# Patient Record
Sex: Female | Born: 2000 | Race: Black or African American | Hispanic: No | Marital: Single | Smoking: Never smoker
Health system: Southern US, Community
[De-identification: ages and names within clinical notes are randomized; demographics above are authoritative.]

## PROBLEM LIST (undated history)

## (undated) DIAGNOSIS — Z789 Other specified health status: Secondary | ICD-10-CM

## (undated) HISTORY — PX: DILATION AND EVACUATION: SHX1459

---
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
## 2021-06-16 ENCOUNTER — Other Ambulatory Visit: Payer: Self-pay

## 2021-06-16 ENCOUNTER — Ambulatory Visit (HOSPITAL_COMMUNITY)
Admission: EM | Admit: 2021-06-16 | Discharge: 2021-06-16 | Disposition: A | Discharge status: Home / Self Care | Payer: Medicaid Other | Attending: Physician Assistant | Admitting: Physician Assistant

## 2021-06-16 ENCOUNTER — Encounter (HOSPITAL_COMMUNITY): Payer: Self-pay

## 2021-06-16 DIAGNOSIS — N760 Acute vaginitis: Secondary | ICD-10-CM | POA: Insufficient documentation

## 2021-06-16 DIAGNOSIS — B9689 Other specified bacterial agents as the cause of diseases classified elsewhere: Secondary | ICD-10-CM | POA: Diagnosis present

## 2021-06-16 DIAGNOSIS — Z113 Encounter for screening for infections with a predominantly sexual mode of transmission: Secondary | ICD-10-CM | POA: Diagnosis present

## 2021-06-16 DIAGNOSIS — N898 Other specified noninflammatory disorders of vagina: Secondary | ICD-10-CM | POA: Insufficient documentation

## 2021-06-16 MED ORDER — METRONIDAZOLE 500 MG PO TABS: 500.0000 mg | ORAL_TABLET | Freq: Two times a day (BID) | ORAL | 0 refills | Status: DC

## 2021-06-16 NOTE — ED Triage Notes (Signed)
Pt presents with vaginal discharge and odor x 4 days.

## 2021-06-16 NOTE — ED Provider Notes (Signed)
[facility]    CSN: [id number] Arrival date & time: 06/16/21  1240      History   Chief Complaint Chief Complaint  Patient presents with   Vaginal Discharge    HPI Julie Clayton is a 20 y.o. female.   Patient presents today with a 4-day history of increased vaginal discharge.  She describes this as copious, clear, malodorous.  She does report using a toy and believes this could have been the cause of her symptoms.  She denies any new sexual partners or specific concerns for STI but is open to testing.  Denies any changes to condoms, personal hygiene products including soaps or detergents, changes to underwear.  She does have a history of similar symptoms ultimately diagnosed as bacterial vaginosis.  Was last treated approximately 2 years ago.  She is confident that she is not pregnant.  She denies any pelvic pain, abdominal pain, nausea, vomiting, fever, urinary symptoms.   History reviewed. No pertinent past medical history.  There are no problems to display for this patient.   History reviewed. No pertinent surgical history.  OB History   No obstetric history on file.      Home Medications    Prior to Admission medications   Medication Sig Start Date End Date Taking? Authorizing Provider  metroNIDAZOLE (FLAGYL) 500 MG tablet Take 1 tablet (500 mg total) by mouth 2 (two) times daily. 06/16/21  Yes Neomi Laidler K, PA-C  diphenhydrAMINE (BENADRYL) 25 MG tablet Take 1 tablet (25 mg total) by mouth every 6 (six) hours. 01/23/17 01/30/17  Maxwell Caul, PA-C  famotidine (PEPCID) 20 MG tablet Take 1 tablet (20 mg total) by mouth 2 (two) times daily. 01/23/17   Maxwell Caul, PA-C    Family History History reviewed. No pertinent family history.  Social History Social History   Tobacco Use   Smoking status: Never   Smokeless tobacco: Never  Substance Use Topics   Alcohol use: No   Drug use: No     Allergies   Patient has no known  allergies.   Review of Systems Review of Systems  Constitutional:  Negative for activity change, appetite change, fatigue and fever.  Respiratory:  Negative for cough and shortness of breath.   Cardiovascular:  Negative for chest pain.  Gastrointestinal:  Negative for abdominal pain, diarrhea, nausea and vomiting.  Genitourinary:  Positive for vaginal discharge. Negative for dysuria, flank pain, frequency, pelvic pain, urgency, vaginal bleeding and vaginal pain.  Musculoskeletal:  Negative for arthralgias and myalgias.  Neurological:  Negative for dizziness, light-headedness and headaches.    Physical Exam Triage Vital Signs ED Triage Vitals  Enc Vitals Group     BP 06/16/21 1530 122/73     Pulse Rate 06/16/21 1529 68     Resp 06/16/21 1529 20     Temp 06/16/21 1529 98.9 F (37.2 C)     Temp Source 06/16/21 1529 Oral     SpO2 06/16/21 1529 100 %     Weight --      Height --      Head Circumference --      Peak Flow --      Pain Score 06/16/21 1526 0     Pain Loc --      Pain Edu? --      Excl. in GC? --    No data found.  Updated Vital Signs BP 122/73   Pulse 68   Temp 98.9 F (37.2 C) (Oral)  Resp 20   LMP 05/28/2021 (Exact Date)   SpO2 100%   Visual Acuity Right Eye Distance:   Left Eye Distance:   Bilateral Distance:    Right Eye Near:   Left Eye Near:    Bilateral Near:     Physical Exam Vitals reviewed.  Constitutional:      General: She is awake. She is not in acute distress.    Appearance: Normal appearance. She is well-developed. She is not ill-appearing.     Comments: Very pleasant female appears stated age in no acute distress  HENT:     Head: Normocephalic and atraumatic.  Cardiovascular:     Rate and Rhythm: Normal rate and regular rhythm.     Heart sounds: Normal heart sounds, S1 normal and S2 normal. No murmur heard. Pulmonary:     Effort: Pulmonary effort is normal.     Breath sounds: Normal breath sounds. No wheezing, rhonchi or  rales.     Comments: Clear to auscultation bilaterally Abdominal:     General: Bowel sounds are normal.     Palpations: Abdomen is soft.     Tenderness: There is no abdominal tenderness. There is no right CVA tenderness, left CVA tenderness, guarding or rebound.     Comments: Benign abdominal exam  Genitourinary:    Comments: Exam deferred Psychiatric:        Behavior: Behavior is cooperative.     UC Treatments / Results  Labs (all labs ordered are listed, but only abnormal results are displayed) Labs Reviewed  CERVICOVAGINAL ANCILLARY ONLY    EKG   Radiology No results found.  Procedures Procedures (including critical care time)  Medications Ordered in UC Medications - No data to display  Initial Impression / Assessment and Plan / UC Course  I have reviewed the triage vital signs and the nursing notes.  Pertinent labs & imaging results that were available during my care of the patient were reviewed by me and considered in my medical decision making (see chart for details).      Patient empirically treated for bacterial vaginosis given clinical presentation.  She was started on metronidazole 500 mg twice daily for 1 week with instruction not to drink any alcohol while on this medication and for 72 hours after completing course due to Antabuse side effects.  We will contact her with her STI swab results if we need to arrange any additional treatment.  Discussed that she should wear loosefitting cotton underwear and use hypoallergenic soaps or detergents.  She is to abstain from sexual activity until results are obtained. Strict return precautions given to which she expressed understanding.  Final Clinical Impressions(s) / UC Diagnoses   Final diagnoses:  Vaginal discharge  BV (bacterial vaginosis)  Routine screening for STI (sexually transmitted infection)     Discharge Instructions      We are treating you for bacterial vaginosis.  Please take metronidazole 500  mg twice daily for 7 days.  Do not drink any alcohol while on this medication and for 72 hours after completing course think cause you to vomit.  Make sure you are drinking plenty of fluid.  Use hypoallergenic soaps and detergents for loosefitting cotton underwear.  We will contact you with your swab results if we need to arrange any additional treatment.  Please abstain from sex until you receive your result.     ED Prescriptions     Medication Sig Dispense Auth. Provider   metroNIDAZOLE (FLAGYL) 500 MG tablet Take 1 tablet (  500 mg total) by mouth 2 (two) times daily. 14 tablet Talasia Saulter, Noberto Retort, PA-C      PDMP not reviewed this encounter.   Jeani Hawking, PA-C 06/16/21 1545

## 2021-06-16 NOTE — Discharge Instructions (Addendum)
We are treating you for bacterial vaginosis.  Please take metronidazole 500 mg twice daily for 7 days.  Do not drink any alcohol while on this medication and for 72 hours after completing course think cause you to vomit.  Make sure you are drinking plenty of fluid.  Use hypoallergenic soaps and detergents for loosefitting cotton underwear.  We will contact you with your swab results if we need to arrange any additional treatment.  Please abstain from sex until you receive your result.

## 2021-06-19 LAB — CERVICOVAGINAL ANCILLARY ONLY
Bacterial Vaginitis (gardnerella): POSITIVE — AB
Candida Glabrata: NEGATIVE
Candida Vaginitis: NEGATIVE
Chlamydia: NEGATIVE
Comment: NEGATIVE
Comment: NEGATIVE
Comment: NEGATIVE
Comment: NEGATIVE
Comment: NEGATIVE
Comment: NORMAL
Neisseria Gonorrhea: NEGATIVE
Trichomonas: NEGATIVE

## 2021-09-14 ENCOUNTER — Ambulatory Visit (HOSPITAL_BASED_OUTPATIENT_CLINIC_OR_DEPARTMENT_OTHER)
Admit: 2021-09-14 | Discharge: 2021-09-14 | Disposition: A | Discharge status: Home / Self Care | Payer: Medicaid Other | Attending: Physician Assistant | Admitting: Physician Assistant

## 2021-09-14 ENCOUNTER — Ambulatory Visit
Admission: EM | Admit: 2021-09-14 | Discharge: 2021-09-14 | Disposition: A | Discharge status: Home / Self Care | Payer: Medicaid Other | Attending: Physician Assistant | Admitting: Physician Assistant

## 2021-09-14 DIAGNOSIS — S6010XA Contusion of unspecified finger with damage to nail, initial encounter: Secondary | ICD-10-CM | POA: Diagnosis present

## 2021-09-14 DIAGNOSIS — M79644 Pain in right finger(s): Secondary | ICD-10-CM | POA: Diagnosis present

## 2021-09-14 DIAGNOSIS — S6991XA Unspecified injury of right wrist, hand and finger(s), initial encounter: Secondary | ICD-10-CM | POA: Insufficient documentation
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
## 2021-09-14 NOTE — Discharge Instructions (Signed)
Please go get an x-ray of your discussed.  We will contact you with these results.  Alternate Tylenol ibuprofen for pain.  Follow-up with hand specialist given this is your dominant hand.  Call them to schedule appointment soon as possible.  If you have any worsening symptoms including difficulty moving your thumb, numbness or tingling, turning colors, feeling cold you need to go to the emergency room.

## 2021-09-14 NOTE — ED Triage Notes (Signed)
Patient states last night she jammed her finger (right thumb) in the door. Patient can move the finger some.

## 2022-02-07 ENCOUNTER — Ambulatory Visit
Admission: EM | Admit: 2022-02-07 | Discharge: 2022-02-07 | Disposition: A | Discharge status: Home / Self Care | Payer: Medicaid Other | Attending: Urgent Care | Admitting: Urgent Care

## 2022-02-07 ENCOUNTER — Encounter: Payer: Self-pay | Admitting: Emergency Medicine

## 2022-02-07 DIAGNOSIS — N949 Unspecified condition associated with female genital organs and menstrual cycle: Secondary | ICD-10-CM | POA: Diagnosis not present

## 2022-02-07 DIAGNOSIS — N760 Acute vaginitis: Secondary | ICD-10-CM | POA: Diagnosis not present

## 2022-02-07 DIAGNOSIS — Z7251 High risk heterosexual behavior: Secondary | ICD-10-CM | POA: Insufficient documentation

## 2022-02-07 DIAGNOSIS — N898 Other specified noninflammatory disorders of vagina: Secondary | ICD-10-CM | POA: Diagnosis not present

## 2022-02-07 DIAGNOSIS — B9689 Other specified bacterial agents as the cause of diseases classified elsewhere: Secondary | ICD-10-CM | POA: Diagnosis not present

## 2022-02-07 DIAGNOSIS — R300 Dysuria: Secondary | ICD-10-CM | POA: Insufficient documentation

## 2022-02-07 LAB — POCT URINALYSIS DIP (MANUAL ENTRY)
Bilirubin, UA: NEGATIVE
Glucose, UA: NEGATIVE mg/dL
Ketones, POC UA: NEGATIVE mg/dL
Nitrite, UA: NEGATIVE
Protein Ur, POC: NEGATIVE mg/dL
Spec Grav, UA: 1.025 (ref 1.010–1.025)
Urobilinogen, UA: 0.2 E.U./dL
pH, UA: 6.5 (ref 5.0–8.0)

## 2022-02-07 LAB — POCT URINE PREGNANCY: Preg Test, Ur: NEGATIVE

## 2022-02-07 MED ORDER — METRONIDAZOLE 500 MG PO TABS: 500.0000 mg | ORAL_TABLET | Freq: Two times a day (BID) | ORAL | 0 refills | Status: DC
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
## 2022-02-07 NOTE — ED Notes (Signed)
Patient states Vaginal discomfort stated yesterday, Some discomfort and odor with urinating. Patient states she had BV in the pass and this feel similar to that.
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
## 2022-02-08 LAB — CERVICOVAGINAL ANCILLARY ONLY
Bacterial Vaginitis (gardnerella): POSITIVE — AB
Candida Glabrata: NEGATIVE
Candida Vaginitis: NEGATIVE
Chlamydia: NEGATIVE
Comment: NEGATIVE
Comment: NEGATIVE
Comment: NEGATIVE
Comment: NEGATIVE
Comment: NEGATIVE
Comment: NORMAL
Neisseria Gonorrhea: NEGATIVE
Trichomonas: NEGATIVE

## 2022-02-08 LAB — URINE CULTURE: Culture: NO GROWTH

## 2022-09-25 ENCOUNTER — Inpatient Hospital Stay (HOSPITAL_COMMUNITY)
Admission: AD | Admit: 2022-09-25 | Discharge: 2022-09-25 | Disposition: A | Discharge status: Home / Self Care | Payer: BLUE CROSS/BLUE SHIELD | Attending: Obstetrics & Gynecology | Admitting: Obstetrics & Gynecology

## 2022-09-25 ENCOUNTER — Inpatient Hospital Stay (HOSPITAL_COMMUNITY): Payer: BLUE CROSS/BLUE SHIELD

## 2022-09-25 ENCOUNTER — Encounter (HOSPITAL_COMMUNITY): Payer: Self-pay | Admitting: Obstetrics & Gynecology

## 2022-09-25 DIAGNOSIS — O26891 Other specified pregnancy related conditions, first trimester: Secondary | ICD-10-CM | POA: Diagnosis not present

## 2022-09-25 DIAGNOSIS — R109 Unspecified abdominal pain: Secondary | ICD-10-CM | POA: Insufficient documentation

## 2022-09-25 DIAGNOSIS — O98811 Other maternal infectious and parasitic diseases complicating pregnancy, first trimester: Secondary | ICD-10-CM | POA: Insufficient documentation

## 2022-09-25 DIAGNOSIS — Z3A01 Less than 8 weeks gestation of pregnancy: Secondary | ICD-10-CM | POA: Insufficient documentation

## 2022-09-25 DIAGNOSIS — O3680X0 Pregnancy with inconclusive fetal viability, not applicable or unspecified: Secondary | ICD-10-CM

## 2022-09-25 DIAGNOSIS — O26851 Spotting complicating pregnancy, first trimester: Secondary | ICD-10-CM | POA: Diagnosis present

## 2022-09-25 DIAGNOSIS — B3731 Acute candidiasis of vulva and vagina: Secondary | ICD-10-CM | POA: Insufficient documentation

## 2022-09-25 DIAGNOSIS — O209 Hemorrhage in early pregnancy, unspecified: Secondary | ICD-10-CM | POA: Diagnosis present

## 2022-09-25 DIAGNOSIS — R102 Pelvic and perineal pain: Secondary | ICD-10-CM | POA: Diagnosis not present

## 2022-09-25 LAB — URINALYSIS, ROUTINE W REFLEX MICROSCOPIC
Bilirubin Urine: NEGATIVE
Glucose, UA: NEGATIVE mg/dL
Ketones, ur: NEGATIVE mg/dL
Nitrite: NEGATIVE
Protein, ur: NEGATIVE mg/dL
Specific Gravity, Urine: 1.006 (ref 1.005–1.030)
pH: 7 (ref 5.0–8.0)

## 2022-09-25 LAB — BASIC METABOLIC PANEL
Anion gap: 8 (ref 5–15)
BUN: 6 mg/dL (ref 6–20)
CO2: 25 mmol/L (ref 22–32)
Calcium: 9.3 mg/dL (ref 8.9–10.3)
Chloride: 100 mmol/L (ref 98–111)
Creatinine, Ser: 0.63 mg/dL (ref 0.44–1.00)
GFR, Estimated: >60 mL/min (ref >60)
Glucose, Bld: 110 mg/dL — ABNORMAL HIGH (ref 70–99)
Potassium: 3.8 mmol/L (ref 3.5–5.1)
Sodium: 133 mmol/L — ABNORMAL LOW (ref 135–145)

## 2022-09-25 LAB — CBC
HCT: 36 % (ref 36.0–46.0)
Hemoglobin: 11.6 g/dL — ABNORMAL LOW (ref 12.0–15.0)
MCH: 27 pg (ref 26.0–34.0)
MCHC: 32.2 g/dL (ref 30.0–36.0)
MCV: 83.9 fL (ref 80.0–100.0)
Platelets: 284 10*3/uL (ref 150–400)
RBC: 4.29 MIL/uL (ref 3.87–5.11)
RDW: 14.1 % (ref 11.5–15.5)
WBC: 7.4 10*3/uL (ref 4.0–10.5)
nRBC: 0 % (ref 0.0–0.2)

## 2022-09-25 LAB — WET PREP, GENITAL
Sperm: NONE SEEN
Trich, Wet Prep: NONE SEEN
WBC, Wet Prep HPF POC: >=10 — AB (ref <10)

## 2022-09-25 LAB — POCT PREGNANCY, URINE: Preg Test, Ur: POSITIVE — AB

## 2022-09-25 LAB — HCG, QUANTITATIVE, PREGNANCY: hCG, Beta Chain, Quant, S: 21904 m[IU]/mL — ABNORMAL HIGH (ref <5)

## 2022-09-25 LAB — HIV ANTIBODY (ROUTINE TESTING W REFLEX): HIV Screen 4th Generation wRfx: NONREACTIVE

## 2022-09-25 MED ORDER — TERCONAZOLE 0.4 % VA CREA: 1.0000 | TOPICAL_CREAM | Freq: Every day | VAGINAL | 0 refills | Status: DC

## 2022-09-25 NOTE — Progress Notes (Signed)
Marie Williams CNM in to discuss test results and d/c plan. Written and verbal d/c instructions given and understanding voiced.  

## 2022-09-25 NOTE — MAU Note (Addendum)
.  Julie Clayton is a 22 y.o. here at 105w2d with vag bleeding earlier today that was brown and mild abdominal cramping. Both have stopped now. Just wanted to be sure everything is ok.  LMP: 08/12/22 Onset of complaint: earlier today Pain score: 0 Vitals:   09/25/22 2013  BP: 133/74  Pulse: 82  Resp: 17  Temp: 99 F (37.2 C)  SpO2: 100%     FHT:na Lab orders placed from triage:  upt and u/a

## 2022-09-25 NOTE — MAU Note (Signed)
Marie Williams CNM in Triage to see pt and discuss plan of care 

## 2022-09-25 NOTE — MAU Provider Note (Addendum)
Chief Complaint: Vaginal Bleeding   Event Date/Time   First Provider Initiated Contact with Patient 09/25/22 2042       SUBJECTIVE HPI: Julie Clayton is a 22 y.o. G1P0 at [redacted]w[redacted]d by LMP who presents to maternity admissions reporting vaginal bleeding today that was brown. Had some minor cramping.  Planning care at [place].Marland Kitchen Has [place] appt at [facility].  She denies vaginal itching/burning, urinary symptoms, h/a, dizziness, n/v, or fever/chills.    Vaginal Bleeding The patient's primary symptoms include pelvic pain and vaginal bleeding. The patient's pertinent negatives include no genital itching or genital odor. This is a new problem. The current episode started today. The pain is mild. She is pregnant. Associated symptoms include abdominal pain. Pertinent negatives include no chills, fever or frequency. The vaginal discharge was bloody. The vaginal bleeding is lighter than menses. She has not been passing clots. She has not been passing tissue. Nothing aggravates the symptoms. She has tried nothing for the symptoms.   RN Note: Julie Clayton is a 22 y.o. here at [redacted]w[redacted]d with vag bleeding earlier today that was brown and mild abdominal cramping. Both have stopped now. Just wanted to be sure everything is ok.  LMP: 08/12/22 Onset of complaint: earlier today          Pain score: 0  History reviewed. No pertinent past medical history. History reviewed. No pertinent surgical history. Social History   Socioeconomic History   Marital status: Single    Spouse name: Not on file   Number of children: Not on file   Years of education: Not on file   Highest education level: Not on file  Occupational History   Not on file  Tobacco Use   Smoking status: Never   Smokeless tobacco: Never  Substance and Sexual Activity   Alcohol use: No   Drug use: No   Sexual activity: Yes  Other Topics Concern   Not on file  Social History Narrative   Not on file   Social Determinants of Health   Financial Resource Strain: Not  on file  Food Insecurity: Not on file  Transportation Needs: Not on file  Physical Activity: Not on file  Stress: Not on file  Social Connections: Not on file  Intimate Partner Violence: Not on file   No current facility-administered medications on file prior to encounter.   Current Outpatient Medications on File Prior to Encounter  Medication Sig Dispense Refill   metroNIDAZOLE (FLAGYL) 500 MG tablet Take 1 tablet (500 mg total) by mouth 2 (two) times daily with a meal. DO NOT CONSUME ALCOHOL WHILE TAKING THIS MEDICATION. 14 tablet 0   No Known Allergies  I have reviewed patient's Past Medical Hx, Surgical Hx, Family Hx, Social Hx, medications and allergies.   ROS:  Review of Systems  Constitutional:  Negative for chills and fever.  Gastrointestinal:  Positive for abdominal pain.  Genitourinary:  Positive for pelvic pain and vaginal bleeding. Negative for frequency.   Review of Systems  Other systems negative   Physical Exam  Physical Exam Patient Vitals for the past 24 hrs:  BP Temp Pulse Resp SpO2 Height Weight  09/25/22 2013 133/74 99 F (37.2 C) 82 17 100 % 5\' 5"  (1.651 m) 73.5 kg   Constitutional: Well-developed, well-nourished female in no acute distress.  Cardiovascular: normal rate Respiratory: normal effort GI: Abd soft, non-tender.  MS: Extremities nontender, no edema, normal ROM Neurologic: Alert and oriented x 4.  GU: Neg CVAT.  PELVIC EXAM: Deferred in lieu  of transvaginal US  LAB RESULTS Results for orders placed or performed during the hospital encounter of 09/25/22 (from the past 24 hour(s))  Urinalysis, Routine w reflex microscopic Urine, Clean Catch     Status: Abnormal   Collection Time: 09/25/22  8:20 PM  Result Value Ref Range   Color, Urine STRAW (A) YELLOW   APPearance HAZY (A) CLEAR   Specific Gravity, Urine 1.006 1.005 - 1.030   pH 7.0 5.0 - 8.0   Glucose, UA NEGATIVE NEGATIVE mg/dL   Hgb urine dipstick SMALL (A) NEGATIVE   Bilirubin  Urine NEGATIVE NEGATIVE   Ketones, ur NEGATIVE NEGATIVE mg/dL   Protein, ur NEGATIVE NEGATIVE mg/dL   Nitrite NEGATIVE NEGATIVE   Leukocytes,Ua SMALL (A) NEGATIVE   RBC / HPF 0-5 0 - 5 RBC/hpf   WBC, UA 0-5 0 - 5 WBC/hpf   Bacteria, UA RARE (A) NONE SEEN   Squamous Epithelial / HPF 6-10 0 - 5 /HPF  Pregnancy, urine POC     Status: Abnormal   Collection Time: 09/25/22  8:28 PM  Result Value Ref Range   Preg Test, Ur POSITIVE (A) NEGATIVE  Wet prep, genital     Status: Abnormal   Collection Time: 09/25/22  8:42 PM   Specimen: PATH Cytology Cervicovaginal Ancillary Only  Result Value Ref Range   Yeast Wet Prep HPF POC PRESENT (A) NONE SEEN   Trich, Wet Prep NONE SEEN NONE SEEN   Clue Cells Wet Prep HPF POC PRESENT (A) NONE SEEN   WBC, Wet Prep HPF POC >=10 (A) <10   Sperm NONE SEEN   CBC     Status: Abnormal   Collection Time: 09/25/22  9:22 PM  Result Value Ref Range   WBC 7.4 4.0 - 10.5 K/uL   RBC 4.29 3.87 - 5.11 MIL/uL   Hemoglobin 11.6 (L) 12.0 - 15.0 g/dL   HCT 36.0 36.0 - 46.0 %   MCV 83.9 80.0 - 100.0 fL   MCH 27.0 26.0 - 34.0 pg   MCHC 32.2 30.0 - 36.0 g/dL   RDW 14.1 11.5 - 15.5 %   Platelets 284 150 - 400 K/uL   nRBC 0.0 0.0 - 0.2 %  ABO/Rh     Status: None   Collection Time: 09/25/22  9:22 PM  Result Value Ref Range   ABO/RH(D)      A POS Performed at [facility], [address] [place], [place] [address]   HIV Antibody (routine testing w rflx)     Status: None   Collection Time: 09/25/22  9:22 PM  Result Value Ref Range   HIV Screen 4th Generation wRfx Non Reactive Non Reactive  Basic metabolic panel     Status: Abnormal   Collection Time: 09/25/22  9:22 PM  Result Value Ref Range   Sodium 133 (L) 135 - 145 mmol/L   Potassium 3.8 3.5 - 5.1 mmol/L   Chloride 100 98 - 111 mmol/L   CO2 25 22 - 32 mmol/L   Glucose, Bld 110 (H) 70 - 99 mg/dL   BUN 6 6 - 20 mg/dL   Creatinine, Ser 0.63 0.44 - 1.00 mg/dL   Calcium 9.3 8.9 - 10.3 mg/dL   GFR,  Estimated >60 >60 mL/min   Anion gap 8 5 - 15  hCG, quantitative, pregnancy     Status: Abnormal   Collection Time: 09/25/22  9:22 PM  Result Value Ref Range   hCG, Beta Chain, Quant, S 21,904 (H) <5 mIU/mL  IMAGING US OB LESS THAN 14 WEEKS WITH OB TRANSVAGINAL  Result Date: 09/25/2022 CLINICAL DATA:  Vaginal bleeding EXAM: OBSTETRIC <14 WK [place] AND TRANSVAGINAL OB US TECHNIQUE: Both transabdominal and transvaginal ultrasound examinations were performed for complete evaluation of the gestation as well as the maternal uterus, adnexal regions, and pelvic cul-de-sac. Transvaginal technique was performed to assess early pregnancy. COMPARISON:  None Available. FINDINGS: Intrauterine gestational sac: Single Yolk sac:  Visualized. Embryo:  Visualized. Cardiac Activity: Visualized. Heart Rate: 116 bpm MSD:   mm    w     d CRL:  3.3 mm   6 w   0 d                  [place] EDC: 05/21/2023 Subchorionic hemorrhage:  None visualized. Maternal uterus/adnexae: No adnexal mass or free fluid. IMPRESSION: Intrauterine pregnancy, 6 weeks 0 days. Fetal heart rate 116 beats per minute. No acute maternal findings. Electronically Signed   By: Charlett Nose M.D.   On: 09/25/2022 22:15     MAU Management/MDM: I have reviewed the triage vital signs and the nursing notes.   Pertinent labs & imaging results that were available during my care of the patient were reviewed by me and considered in my medical decision making (see chart for details).      I have reviewed her medical records including past results, notes and treatments. Medical, Surgical, and family history were reviewed.  Medications and recent lab tests were reviewed  Ordered usual first trimester r/o ectopic labs.   Pelvic cultures done Will check baseline Ultrasound to rule out ectopic.  This bleeding/pain can represent a normal pregnancy with bleeding, spontaneous abortion or even an ectopic which can be life-threatening.  The process as listed above helps to  determine which of these is present.  Reviewed findings with patient who is pleased   She had an app[facility] at [facility] already.  ASSESSMENT Pregnancy at [redacted]w[redacted]d Bleeding in early pregnancy Confirmed LIve IUP at [redacted]w[redacted]d   PLAN Discharge home Encouraged to start prenatal care Bleeding precautions Rx Terazole for yeast  Pt stable at time of discharge. Encouraged to return here if she develops worsening of symptoms, increase in pain, fever, or other concerning symptoms.    Wynelle Bourgeois CNM, MSN Certified Nurse-Midwife 09/25/2022  8:42 PM

## 2022-09-26 DIAGNOSIS — O209 Hemorrhage in early pregnancy, unspecified: Secondary | ICD-10-CM | POA: Diagnosis present

## 2022-09-26 LAB — ABO/RH: ABO/RH(D): A POS

## 2022-09-26 LAB — GC/CHLAMYDIA PROBE AMP (~~LOC~~) NOT AT ARMC
Chlamydia: NEGATIVE
Comment: NEGATIVE
Comment: NORMAL
Neisseria Gonorrhea: NEGATIVE

## 2022-10-10 ENCOUNTER — Ambulatory Visit (HOSPITAL_BASED_OUTPATIENT_CLINIC_OR_DEPARTMENT_OTHER): Payer: Self-pay

## 2022-10-10 ENCOUNTER — Encounter (HOSPITAL_BASED_OUTPATIENT_CLINIC_OR_DEPARTMENT_OTHER): Payer: Self-pay

## 2022-10-10 DIAGNOSIS — O3680X0 Pregnancy with inconclusive fetal viability, not applicable or unspecified: Secondary | ICD-10-CM

## 2022-10-15 ENCOUNTER — Telehealth (HOSPITAL_BASED_OUTPATIENT_CLINIC_OR_DEPARTMENT_OTHER): Payer: Self-pay | Admitting: *Deleted

## 2022-10-15 NOTE — Telephone Encounter (Signed)
LMOVM for pt to call to set up appt;

## 2022-12-08 ENCOUNTER — Emergency Department (HOSPITAL_BASED_OUTPATIENT_CLINIC_OR_DEPARTMENT_OTHER)
Admission: EM | Admit: 2022-12-08 | Discharge: 2022-12-08 | Disposition: A | Discharge status: Home / Self Care | Payer: Self-pay | Attending: Emergency Medicine | Admitting: Emergency Medicine

## 2022-12-08 ENCOUNTER — Other Ambulatory Visit: Payer: Self-pay

## 2022-12-08 ENCOUNTER — Encounter (HOSPITAL_BASED_OUTPATIENT_CLINIC_OR_DEPARTMENT_OTHER): Payer: Self-pay

## 2022-12-08 DIAGNOSIS — F10920 Alcohol use, unspecified with intoxication, uncomplicated: Secondary | ICD-10-CM | POA: Insufficient documentation

## 2022-12-08 MED ORDER — ONDANSETRON HCL 4 MG/2ML IJ SOLN
4.0000 mg | Freq: Once | INTRAMUSCULAR | Status: AC
  Administered 2022-12-08: 4 mg via INTRAVENOUS
  Filled 2022-12-08: qty 2

## 2022-12-08 MED ORDER — SODIUM CHLORIDE 0.9 % IV BOLUS
1000.0000 mL | Freq: Once | INTRAVENOUS | Status: AC
  Administered 2022-12-08: 1000 mL via INTRAVENOUS

## 2022-12-08 NOTE — ED Triage Notes (Addendum)
Patient states "wants to get checked and make sure didn't drink to much"  alert oriented. + Nausea and vomiting. Ambulatory to room

## 2022-12-08 NOTE — ED Provider Notes (Signed)
[facility] Emergency Department Provider Note MRN:  [id number]  Arrival date & time: 12/08/22     Chief Complaint   Alcohol Intoxication   History of Present Illness   Julie Clayton is a 22 y.o. year-old female with no pertinent past medical history presenting to the ED with chief complaint of alcohol intoxication.  Patient concerned that she drank too much too quickly.  According to friend she drank at least 9 shots of liquor over the course of 1 or 2 hours.  Feeling very intoxicated, feeling nauseated.  Denies any trauma or pain at this time.  Drank a lot because going through a lot emotionally at this time.  Review of Systems  A thorough review of systems was obtained and all systems are negative except as noted in the HPI and PMH.   Patient's Health History   History reviewed. No pertinent past medical history.  History reviewed. No pertinent surgical history.  History reviewed. No pertinent family history.  Social History   Socioeconomic History   Marital status: Single    Spouse name: Not on file   Number of children: Not on file   Years of education: Not on file   Highest education level: Not on file  Occupational History   Not on file  Tobacco Use   Smoking status: Never   Smokeless tobacco: Never  Vaping Use   Vaping Use: Never used  Substance and Sexual Activity   Alcohol use: Yes   Drug use: No   Sexual activity: Yes  Other Topics Concern   Not on file  Social History Narrative   Not on file   Social Determinants of Health   Financial Resource Strain: Not on file  Food Insecurity: Not on file  Transportation Needs: Not on file  Physical Activity: Not on file  Stress: Not on file  Social Connections: Not on file  Intimate Partner Violence: Not on file     Physical Exam   Vitals:   12/08/22 0415 12/08/22 0430  BP: 124/80 106/73  Pulse: 68 97  Resp:  16  Temp:    SpO2: 100% 100%    CONSTITUTIONAL: Well-appearing,  NAD NEURO/PSYCH:  Alert and oriented x 3, no focal deficits EYES:  eyes equal and reactive ENT/NECK:  no LAD, no JVD CARDIO: Regular rate, well-perfused, normal S1 and S2 PULM:  CTAB no wheezing or rhonchi GI/GU:  non-distended, non-tender MSK/SPINE:  No gross deformities, no edema SKIN:  no rash, atraumatic   *Additional and/or pertinent findings included in MDM below  Diagnostic and Interventional Summary    EKG Interpretation  Date/Time:    Ventricular Rate:    PR Interval:    QRS Duration:   QT Interval:    QTC Calculation:   R Axis:     Text Interpretation:         Labs Reviewed - No data to display  No orders to display    Medications  sodium chloride 0.9 % bolus 1,000 mL (0 mLs Intravenous Stopped 12/08/22 0438)  ondansetron (ZOFRAN) injection 4 mg (4 mg Intravenous Given 12/08/22 0316)     Procedures  /  Critical Care Procedures  ED Course and Medical Decision Making  Initial Impression and Ddx Alcohol intoxication.  Currently intoxicated but awake, conversant.  Nontraumatic.  Vital signs normal, will allow for metabolization and reassess.  Past medical/surgical history that increases complexity of ED encounter: None  Interpretation of Diagnostics Laboratory and/or imaging options to aid in the diagnosis/care  of the patient were considered.  After careful history and physical examination, it was determined that there was no indication for diagnostics at this time. Patient Reassessment and Ultimate Disposition/Management     Patient feeling better, clinically appears sober enough for discharge.  Has a ride home.  Patient management required discussion with the following services or consulting groups:  None  Complexity of Problems Addressed Acute illness or injury that poses threat of life of bodily function  Additional Data Reviewed and Analyzed Further history obtained from: Further history from spouse/family member  Additional Factors Impacting ED  Encounter Risk None  Barth Kirks. Sedonia Small, [place] [email or web address]  Final Clinical Impressions(s) / ED Diagnoses     ICD-10-CM   1. Alcoholic intoxication without complication ([place])  0000000       ED Discharge Orders     None        Discharge Instructions Discussed with and Provided to Patient:     Discharge Instructions      You were evaluated in the Emergency Department and after careful evaluation, we did not find any emergent condition requiring admission or further testing in the hospital.  Your exam/testing today is overall reassuring.  Please return to the Emergency Department if you experience any worsening of your condition.   Thank you for allowing [place] to be a part of your care.       Maudie Flakes, MD 12/08/22 [phone]

## 2022-12-08 NOTE — Discharge Instructions (Signed)
You were evaluated in the Emergency Department and after careful evaluation, we did not find any emergent condition requiring admission or further testing in the hospital.  Your exam/testing today is overall reassuring.  Please return to the Emergency Department if you experience any worsening of your condition.   Thank you for allowing us to be a part of your care. 

## 2023-04-01 ENCOUNTER — Encounter (HOSPITAL_BASED_OUTPATIENT_CLINIC_OR_DEPARTMENT_OTHER): Payer: Self-pay

## 2023-04-01 ENCOUNTER — Ambulatory Visit (INDEPENDENT_AMBULATORY_CARE_PROVIDER_SITE_OTHER): Payer: Self-pay | Admitting: Obstetrics & Gynecology

## 2023-04-01 ENCOUNTER — Ambulatory Visit (INDEPENDENT_AMBULATORY_CARE_PROVIDER_SITE_OTHER): Payer: Self-pay | Admitting: *Deleted

## 2023-04-01 ENCOUNTER — Other Ambulatory Visit (INDEPENDENT_AMBULATORY_CARE_PROVIDER_SITE_OTHER): Payer: BLUE CROSS/BLUE SHIELD

## 2023-04-01 ENCOUNTER — Encounter (HOSPITAL_BASED_OUTPATIENT_CLINIC_OR_DEPARTMENT_OTHER): Payer: Self-pay | Admitting: Obstetrics & Gynecology

## 2023-04-01 ENCOUNTER — Other Ambulatory Visit (HOSPITAL_COMMUNITY)
Admission: RE | Admit: 2023-04-01 | Discharge: 2023-04-01 | Disposition: A | Discharge status: Home / Self Care | Payer: BLUE CROSS/BLUE SHIELD | Source: Ambulatory Visit | Attending: Obstetrics & Gynecology | Admitting: Obstetrics & Gynecology

## 2023-04-01 VITALS — BP 130/73 | HR 55 | Ht 64.0 in | Wt 162.0 lb

## 2023-04-01 DIAGNOSIS — Z3401 Encounter for supervision of normal first pregnancy, first trimester: Secondary | ICD-10-CM

## 2023-04-01 DIAGNOSIS — Z3A10 10 weeks gestation of pregnancy: Secondary | ICD-10-CM | POA: Diagnosis not present

## 2023-04-01 DIAGNOSIS — F419 Anxiety disorder, unspecified: Secondary | ICD-10-CM

## 2023-04-01 DIAGNOSIS — Z3481 Encounter for supervision of other normal pregnancy, first trimester: Secondary | ICD-10-CM

## 2023-04-01 DIAGNOSIS — Z3480 Encounter for supervision of other normal pregnancy, unspecified trimester: Secondary | ICD-10-CM

## 2023-04-01 DIAGNOSIS — O3680X1 Pregnancy with inconclusive fetal viability, fetus 1: Secondary | ICD-10-CM

## 2023-04-01 DIAGNOSIS — O3680X0 Pregnancy with inconclusive fetal viability, not applicable or unspecified: Secondary | ICD-10-CM

## 2023-04-01 DIAGNOSIS — Z1331 Encounter for screening for depression: Secondary | ICD-10-CM

## 2023-04-01 MED ORDER — PRENATAL VITAMINS 28-0.8 MG PO TABS: 1.0000 | ORAL_TABLET | Freq: Every day | ORAL | 11 refills | Status: DC

## 2023-04-01 MED ORDER — ASPIRIN 81 MG PO CHEW: 81.0000 mg | CHEWABLE_TABLET | Freq: Every day | ORAL | 8 refills | Status: DC

## 2023-04-01 NOTE — Progress Notes (Signed)
New OB Intake  I explained I am completing New OB Intake today. We discussed EDD of 10/23/2023, by Last Menstrual Period. Pt is G2P0010. I reviewed her allergies, medications and Medical/Surgical/OB history.    Patient Active Problem List   Diagnosis Date Noted   Supervision of other normal pregnancy, antepartum 04/01/2023    Concerns addressed today  Delivery Plans Plans to deliver at [facility]. Discussed the nature of our practice with multiple providers including residents and students. Due to the size of the practice, the delivering provider may not be the same as those providing prenatal care.   Patient is not interested in water birth.   MyChart/Babyscripts MyChart access verified. I explained pt will have some visits in office and some virtually. Babyscripts instructions given and order placed. Patient verifies receipt of registration text/e-mail. Account successfully created and app downloaded.  Blood Pressure Cuff/Weight Scale Patient is self-pay; explained patient will be given BP cuff at first prenatal appt. Explained after first prenatal appt pt will check weekly and document in Babyscripts. Patient does not have weight scale; patient may purchase if they desire to track weight weekly in Babyscripts.  Anatomy US Explained first scheduled [place] will be around 19 weeks. Anatomy US scheduled for 05/31/23 at 0930.  Is patient a CenteringPregnancy candidate?  Declined Declined due to Group setting    Is patient a Mom+Baby Combined Care candidate?  Declined   If accepted, confirm patient does not intend to move from the area for at least 12 months, then notify Mom+Baby staff  Interested in [place]? no   First visit review I reviewed new OB appt with patient. Explained pt will be seen by Dr. Hyacinth Meeker at first visit. Discussed Avelina Laine genetic screening with patient. Pt would like both Panorama and Horizon.. Routine prenatal labs are needed.   Last Pap No results found for:  "DIAGPAP"  Harrie Jeans, RN 04/01/2023  11:38 AM

## 2023-04-01 NOTE — Progress Notes (Unsigned)
History:   Georgi Tuel is a 22 y.o. G2P0010 at [redacted]w[redacted]d by {Ob dating:14516} being seen today for her first obstetrical visit.  Her obstetrical history is significant for {ob risk factors:10154}. Patient {does/does not:19097} intend to breast feed. Pregnancy history fully reviewed.  Patient reports {sx:14538}.      HISTORY: OB History  Gravida Para Term Preterm AB Living  2 0 0 0 1 0  SAB IAB Ectopic Multiple Live Births  0 1 0 0 0    # Outcome Date GA Lbr Len/2nd Weight Sex Type Anes PTL Lv  2 Current           1 IAB 11/2022            Last pap smear was done *** and was {Normal/Abnormal Appearance:21344::"normal"}  History reviewed. No pertinent past medical history. History reviewed. No pertinent surgical history. Family History  Problem Relation Age of Onset   Asthma Mother    Hypertension Father    Asthma Brother    Diabetes Maternal Grandmother    Cancer Maternal Grandfather        lung   Alopecia Paternal Grandmother    Social History   Tobacco Use   Smoking status: Never   Smokeless tobacco: Never  Vaping Use   Vaping status: Never Used  Substance Use Topics   Alcohol use: Not Currently   Drug use: No   No Known Allergies No current outpatient medications on file prior to visit.   No current facility-administered medications on file prior to visit.    Review of Systems Pertinent items noted in HPI and remainder of comprehensive ROS otherwise negative.  Physical Exam:   Vitals:   04/01/23 1036  BP: 130/73  Pulse: (!) 55  Weight: 162 lb (73.5 kg)     ***Bedside Ultrasound for FHR check: Viable intrauterine pregnancy with positive cardiac activity noted, fetal heart rate ***bpm Patient informed that the ultrasound is considered a limited obstetric ultrasound and is not intended to be a complete ultrasound exam.  Patient also informed that the ultrasound is not being completed with the intent of assessing for fetal or placental anomalies or any  pelvic abnormalities.  Explained that the purpose of today's ultrasound is to assess for fetal heart rate.  Patient acknowledges the purpose of the exam and the limitations of the study. General: well-developed, well-nourished female in no acute distress  Breasts:  normal appearance, no masses or tenderness bilaterally  Skin: normal coloration and turgor, no rashes  Neurologic: oriented, normal, negative, normal mood  Extremities: normal strength, tone, and muscle mass, ROM of all joints is normal  HEENT PERRLA, extraocular movement intact and sclera clear, anicteric  Neck supple and no masses  Cardiovascular: regular rate and rhythm  Respiratory:  no respiratory distress, normal breath sounds  Abdomen: soft, non-tender; bowel sounds normal; no masses,  no organomegaly  Pelvic: normal external genitalia, no lesions, normal vaginal mucosa, normal vaginal discharge, normal cervix, ***pap smear done. Uterine size:      Assessment:    Pregnancy: G2P0010 Patient Active Problem List   Diagnosis Date Noted   Supervision of other normal pregnancy, antepartum 04/01/2023     Plan:    1. Positive depression screening ***  2. Anxiety ***  3. [redacted] weeks gestation of pregnancy *** - ABO/Rh - Antibody screen - CBC - Hepatitis B surface antigen - HIV Antibody (routine testing w rflx) - HIV (Save tube for possible reflex) - RPR - Rubella screen - Hepatitis  C antibody - Urine Culture - Cytology - PAP( [facility]) - [place] MFM OB COMP + 14 WK; Future  4. Encounter for supervision of normal first pregnancy in first trimester *** - ABO/Rh - Antibody screen - CBC - Hepatitis B surface antigen - HIV Antibody (routine testing w rflx) - HIV (Save tube for possible reflex) - RPR - Rubella screen - Hepatitis C antibody - Urine Culture - Cytology - PAP( [place]) - [place] MFM OB COMP + 14 WK; Future  5. Encounter for supervision of other normal pregnancy in first trimester *** - PANORAMA  PRENATAL TEST - [place]ON Basic Panel  6. Encounter for supervision of other normal pregnancy, first trimester ***  7. Supervision of other normal pregnancy, antepartum ***   Initial labs drawn. Continue prenatal vitamins. Problem list reviewed and updated. Genetic Screening discussed, {Blank multiple:19196::"First trimester screen","Quad screen","NIPS"}: {requests/ordered/declines:14581}. Ultrasound discussed; fetal anatomic survey: {requests/ordered/declines:14581}. Anticipatory guidance about prenatal visits given including labs, ultrasounds, and testing. Discussed usage of Babyscripts and virtual visits as additional source of managing and completing prenatal visits in midst of coronavirus and pandemic.   Encouraged to complete MyChart Registration for her ability to review results, send requests, and have questions addressed.  The nature of [place] [facility]ulty Practice with multiple MDs and Advanced Practice Providers was explained to patient; also emphasized that residents, students are part of our team. Routine obstetric precautions reviewed. Encouraged to seek out care at office or emergency room [facility] preferred) for urgent and/or emergent concerns. No follow-ups on file.     Lum Keas, MD, FACOG Obstetrician & Gynecologist, [facility] for [facility], [facility]

## 2023-04-02 LAB — CBC
Hematocrit: 38.2 % (ref 34.0–46.6)
Hemoglobin: 11.7 g/dL (ref 11.1–15.9)
MCH: 26.3 pg — ABNORMAL LOW (ref 26.6–33.0)
MCHC: 30.6 g/dL — ABNORMAL LOW (ref 31.5–35.7)
MCV: 86 fL (ref 79–97)
Platelets: 241 10*3/uL (ref 150–450)
RBC: 4.45 x10E6/uL (ref 3.77–5.28)
RDW: 14.9 % (ref 11.7–15.4)
WBC: 4.8 10*3/uL (ref 3.4–10.8)

## 2023-04-02 LAB — ANTIBODY SCREEN: Antibody Screen: NEGATIVE

## 2023-04-02 LAB — HIV ANTIBODY (ROUTINE TESTING W REFLEX): HIV Screen 4th Generation wRfx: NONREACTIVE

## 2023-04-02 LAB — HEPATITIS C ANTIBODY: Hep C Virus Ab: NONREACTIVE

## 2023-04-02 LAB — HEPATITIS B SURFACE ANTIGEN: Hepatitis B Surface Ag: NEGATIVE

## 2023-04-02 LAB — ABO/RH: Rh Factor: POSITIVE

## 2023-04-02 LAB — URINE CULTURE

## 2023-04-02 LAB — RUBELLA SCREEN: Rubella Antibodies, IGG: 1.04 index (ref >0.99)

## 2023-04-02 LAB — RPR: RPR Ser Ql: NONREACTIVE

## 2023-04-03 LAB — CYTOLOGY - PAP
Chlamydia: NEGATIVE
Comment: NEGATIVE
Comment: NORMAL
Diagnosis: NEGATIVE
Neisseria Gonorrhea: NEGATIVE

## 2023-04-05 ENCOUNTER — Telehealth (HOSPITAL_BASED_OUTPATIENT_CLINIC_OR_DEPARTMENT_OTHER): Payer: Self-pay | Admitting: *Deleted

## 2023-04-05 NOTE — Telephone Encounter (Signed)
LMOVM for pt to call office regarding elevated BP reading.

## 2023-04-09 ENCOUNTER — Telehealth (HOSPITAL_BASED_OUTPATIENT_CLINIC_OR_DEPARTMENT_OTHER): Payer: Self-pay | Admitting: *Deleted

## 2023-04-09 NOTE — Telephone Encounter (Signed)
Attempted to call pt. VM full, unable to leave message.

## 2023-05-06 ENCOUNTER — Encounter (HOSPITAL_BASED_OUTPATIENT_CLINIC_OR_DEPARTMENT_OTHER): Payer: Self-pay | Admitting: Obstetrics & Gynecology

## 2023-05-06 ENCOUNTER — Ambulatory Visit (INDEPENDENT_AMBULATORY_CARE_PROVIDER_SITE_OTHER): Payer: BLUE CROSS/BLUE SHIELD | Admitting: Obstetrics & Gynecology

## 2023-05-06 VITALS — BP 134/80 | HR 78 | Wt 162.6 lb

## 2023-05-06 DIAGNOSIS — Z3A15 15 weeks gestation of pregnancy: Secondary | ICD-10-CM

## 2023-05-06 DIAGNOSIS — Z3480 Encounter for supervision of other normal pregnancy, unspecified trimester: Secondary | ICD-10-CM

## 2023-05-06 NOTE — Progress Notes (Signed)
   PRENATAL VISIT NOTE  Subjective:  Julie Clayton is a 22 y.o. G2P0010 at [redacted]w[redacted]d being seen today for ongoing prenatal care.  She is currently monitored for the following issues for this low-risk pregnancy and has Supervision of other normal pregnancy, antepartum on their problem list.  Patient reports no complaints.  Contractions: Not present. Vag. Bleeding: None.  Movement: Present. Denies leaking of fluid.   The following portions of the patient's history were reviewed and updated as appropriate: allergies, current medications, past family history, past medical history, past social history, past surgical history and problem list.   Objective:   Vitals:   05/06/23 1141  BP: 134/80  Pulse: 78  Weight: 162 lb 9.6 oz (73.8 kg)    Fetal Status: Fetal Heart Rate (bpm): 145   Movement: Present     General:  Alert, oriented and cooperative. Patient is in no acute distress.  Skin: Skin is warm and dry. No rash noted.   Cardiovascular: Normal heart rate noted  Respiratory: Normal respiratory effort, no problems with respiration noted  Abdomen: Soft, gravid, appropriate for gestational age.  Pain/Pressure: Absent     Pelvic: Cervical exam deferred        Extremities: Normal range of motion.  Edema: None  Mental Status: Normal mood and affect. Normal behavior. Normal judgment and thought content.   Assessment and Plan:  Pregnancy: G2P0010 at [redacted]w[redacted]d 1. Supervision of other normal pregnancy, antepartum - on PNV - importance of baby ASA discussed - anatomy u/s schedule 05/31/2023 - recheck 4 weeks  2. [redacted] weeks gestation of pregnancy - AFP, Serum, Open Spina Bifida  Preterm labor symptoms and general obstetric precautions including but not limited to vaginal bleeding, contractions, leaking of fluid and fetal movement were reviewed in detail with the patient. Please refer to After Visit Summary for other counseling recommendations.   Return in about 4 weeks (around 06/03/2023).  Future  Appointments  Date Time Provider Department Center  05/31/2023  9:15 AM [facility] NURSE [facility]  05/31/2023  9:30 AM [facility] US4 [facility]  06/03/2023 10:35 AM Jerene Bears, MD [facility]  06/28/2023 10:15 AM Jerene Bears, MD [facility]  07/26/2023 10:15 AM Jerene Bears, MD [facility]  08/16/2023 10:55 AM Jerene Bears, MD [facility]  08/30/2023 10:35 AM Jerene Bears, MD [facility]  09/13/2023 10:35 AM Jerene Bears, MD [facility]  09/27/2023 10:05 AM Jerene Bears, MD [facility]  10/04/2023 10:15 AM Jerene Bears, MD [facility]  10/11/2023 10:15 AM Jerene Bears, MD [facility]  10/18/2023 10:55 AM Jerene Bears, MD [facility]  10/25/2023 10:35 AM Jerene Bears, MD [facility]    Jerene Bears, MD

## 2023-05-08 LAB — AFP, SERUM, OPEN SPINA BIFIDA
AFP MoM: 0.8
AFP Value: 25.8 ng/mL
Gest. Age on Collection Date: 15.5 wk
Maternal Age At EDD: 22.7 a
OSBR Risk 1 IN: 10000
Test Results:: NEGATIVE
Weight: 162 [lb_av]

## 2023-05-21 ENCOUNTER — Encounter (HOSPITAL_BASED_OUTPATIENT_CLINIC_OR_DEPARTMENT_OTHER): Payer: Self-pay | Admitting: Obstetrics & Gynecology

## 2023-05-27 ENCOUNTER — Encounter (HOSPITAL_BASED_OUTPATIENT_CLINIC_OR_DEPARTMENT_OTHER): Payer: Self-pay | Admitting: *Deleted

## 2023-05-27 ENCOUNTER — Encounter (HOSPITAL_BASED_OUTPATIENT_CLINIC_OR_DEPARTMENT_OTHER): Payer: Self-pay | Admitting: Obstetrics & Gynecology

## 2023-05-31 ENCOUNTER — Ambulatory Visit: Payer: BLUE CROSS/BLUE SHIELD | Attending: Obstetrics & Gynecology

## 2023-05-31 ENCOUNTER — Ambulatory Visit: Payer: BLUE CROSS/BLUE SHIELD

## 2023-05-31 VITALS — BP 127/67 | HR 69

## 2023-05-31 DIAGNOSIS — Z3A10 10 weeks gestation of pregnancy: Secondary | ICD-10-CM | POA: Diagnosis not present

## 2023-05-31 DIAGNOSIS — Z3401 Encounter for supervision of normal first pregnancy, first trimester: Secondary | ICD-10-CM

## 2023-05-31 DIAGNOSIS — Z3480 Encounter for supervision of other normal pregnancy, unspecified trimester: Secondary | ICD-10-CM

## 2023-06-03 ENCOUNTER — Encounter (HOSPITAL_BASED_OUTPATIENT_CLINIC_OR_DEPARTMENT_OTHER): Payer: Self-pay | Admitting: Obstetrics & Gynecology

## 2023-06-03 ENCOUNTER — Telehealth (HOSPITAL_BASED_OUTPATIENT_CLINIC_OR_DEPARTMENT_OTHER): Payer: BLUE CROSS/BLUE SHIELD | Admitting: Obstetrics & Gynecology

## 2023-06-03 VITALS — BP 130/77 | Wt 165.6 lb

## 2023-06-03 DIAGNOSIS — Z3A19 19 weeks gestation of pregnancy: Secondary | ICD-10-CM

## 2023-06-03 DIAGNOSIS — Z3480 Encounter for supervision of other normal pregnancy, unspecified trimester: Secondary | ICD-10-CM

## 2023-06-03 DIAGNOSIS — Z3482 Encounter for supervision of other normal pregnancy, second trimester: Secondary | ICD-10-CM

## 2023-06-03 NOTE — Progress Notes (Signed)
I connected with Julie Clayton 06/03/23 at 10:35 AM EDT by: MyChart video and verified that I am speaking with the correct person using two identifiers.  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     I discussed the limitations, risks, security and privacy concerns of performing an evaluation and management service by MyChart video and the availability of in person appointments. I also discussed with the patient that there may be a patient responsible charge related to this service. By engaging in this virtual visit, you consent to the provision of healthcare.  Additionally, you authorize for your insurance to be billed for the services provided during this visit.  The patient expressed understanding and agreed to proceed.  The following staff members participated in the virtual visit:  Raechel Ache, RN    PRENATAL VISIT NOTE  Subjective:  Julie Clayton is a 22 y.o. G2P0010 at [redacted]w[redacted]d  for virtual visit for ongoing prenatal care.  She is currently monitored for the following issues for this low-risk pregnancy and has Supervision of other normal pregnancy, antepartum on their problem list.  Patient reports  her feet are swelling when she has a long day at work.  Denies headache .  Contractions: Not present. Vag. Bleeding: None.  Movement: Present. Denies leaking of fluid.   The following portions of the patient's history were reviewed and updated as appropriate: allergies, current medications, past family history, past medical history, past social history, past surgical history and problem list.   Objective:   Vitals:   06/03/23 1005  Weight: 165 lb 9.6 oz (75.1 kg)   Self-Obtained  Fetal Status:     Movement: Present     Assessment and Plan:  Pregnancy: G2P0010 at [redacted]w[redacted]d 1. Supervision of other normal pregnancy, antepartum - on PNV and baby ASA  2. [redacted] weeks gestation of pregnancy - recheck 4 weeks  Preterm labor symptoms and general  obstetric precautions including but not limited to vaginal bleeding, contractions, leaking of fluid and fetal movement were reviewed in detail with the patient.  Return in about 4 weeks (around 07/01/2023).  Future Appointments  Date Time Provider Department Center  06/28/2023 10:15 AM Jerene Bears, MD [facility]  07/26/2023 10:15 AM Jerene Bears, MD [facility]  08/16/2023 10:55 AM Jerene Bears, MD [facility]  08/30/2023 10:35 AM Jerene Bears, MD [facility]  09/13/2023 10:35 AM Jerene Bears, MD [facility]  09/27/2023 10:15 AM Jerene Bears, MD [facility]  10/04/2023 10:15 AM Jerene Bears, MD [facility]  10/11/2023 10:15 AM Jerene Bears, MD [facility]  10/18/2023 10:55 AM Jerene Bears, MD [facility]  10/25/2023 10:35 AM Jerene Bears, MD [facility]     Time spent on virtual visit: 11 minutes  Jerene Bears, MD

## 2023-06-04 ENCOUNTER — Encounter (HOSPITAL_BASED_OUTPATIENT_CLINIC_OR_DEPARTMENT_OTHER): Payer: Self-pay | Admitting: Obstetrics & Gynecology

## 2023-06-28 ENCOUNTER — Ambulatory Visit (HOSPITAL_BASED_OUTPATIENT_CLINIC_OR_DEPARTMENT_OTHER): Payer: BLUE CROSS/BLUE SHIELD | Admitting: Certified Nurse Midwife

## 2023-06-28 VITALS — BP 116/68 | HR 68 | Wt 166.4 lb

## 2023-06-28 DIAGNOSIS — Z3482 Encounter for supervision of other normal pregnancy, second trimester: Secondary | ICD-10-CM

## 2023-06-28 DIAGNOSIS — Z3480 Encounter for supervision of other normal pregnancy, unspecified trimester: Secondary | ICD-10-CM

## 2023-06-28 DIAGNOSIS — F418 Other specified anxiety disorders: Secondary | ICD-10-CM | POA: Insufficient documentation

## 2023-06-28 DIAGNOSIS — Z3A23 23 weeks gestation of pregnancy: Secondary | ICD-10-CM

## 2023-06-28 MED ORDER — ESCITALOPRAM OXALATE 10 MG PO TABS: 10.0000 mg | ORAL_TABLET | Freq: Every day | ORAL | 12 refills | Status: DC

## 2023-06-28 MED ORDER — PRENATAL VITAMINS 28-0.8 MG PO TABS: 1.0000 | ORAL_TABLET | Freq: Every day | ORAL | 11 refills | Status: AC

## 2023-06-28 MED ORDER — ASPIRIN 81 MG PO CHEW: 81.0000 mg | CHEWABLE_TABLET | Freq: Every day | ORAL | 8 refills | Status: DC

## 2023-06-28 NOTE — Addendum Note (Signed)
Addended byMerrilee Jansky on: 06/28/2023 01:51 PM   Modules accepted: Orders

## 2023-06-28 NOTE — Progress Notes (Signed)
  Here with FOB "[name]". He lives in [place]. She lives in [place]. They are not currently "in a relationship" but hope to co-parent the baby. Having a daughter.     PRENATAL VISIT NOTE  Subjective:  Julie Clayton is a 22 y.o. G2P0010 at [redacted]w[redacted]d being seen today for ongoing prenatal care.  She is currently monitored for the following issues for this low-risk pregnancy and has Supervision of other normal pregnancy, antepartum on their problem list.  Patient reports no bleeding, no contractions, no cramping, and no leaking.  Contractions: Not present. Vag. Bleeding: None.  Movement: Present. Denies leaking of fluid.   The following portions of the patient's history were reviewed and updated as appropriate: allergies, current medications, past family history, past medical history, past social history, past surgical history and problem list.   Objective:   Vitals:   06/28/23 1028  BP: 116/68  Pulse: 68  Weight: 166 lb 6.4 oz (75.5 kg)    Fetal Status:     Movement: Present     General:  Alert, oriented and cooperative. Patient is in no acute distress.  Skin: Skin is warm and dry. No rash noted.   Cardiovascular: Normal heart rate noted  Respiratory: Normal respiratory effort, no problems with respiration noted  Abdomen: Soft, gravid, appropriate for gestational age.  Pain/Pressure: Absent     Pelvic: Cervical exam deferred        Extremities: Normal range of motion.  Edema: Trace (Feet)  Mental Status: Depression/Anxiety screening by CNM positive. Pt reports anxiety, worries a lot, decreased interest in doing things she previously enjoyed.    Assessment and Plan:  Pregnancy: G2P0010 at [redacted]w[redacted]d  1. Depression with anxiety -Depression/anxiety screening by CNM today positive for mild depression/anxiety, partially stemming from relationship with FOB although pt not able to go in detail today. She reassures CNM that there is no abuse or domestic violence happening and she is safe and well  cared for. Pt reports that her Mom has also encouraged her to discuss depression/anxiety  [place]. We discussed availability counseling and/or medication and she was agreeable to a trial of medication at this time. Emotional support given.  -Rx sent Lexapro -Follow-up on medication effectiveness at next ROB  2. Supervision of other normal pregnancy, antepartum -Flu Vaccine offered -Discussed 2hr Fasting GTT at next ROB   3. [redacted] weeks gestation of pregnancy RTO 4weeks for ROB.  Toma Aran Nicolo Tomko              There are no diagnoses linked to this encounter. Preterm labor symptoms and general obstetric precautions including but not limited to vaginal bleeding, contractions, leaking of fluid and fetal movement were reviewed in detail with the patient. Please refer to After Visit Summary for other counseling recommendations.   No follow-ups on file.  Future Appointments  Date Time Provider Department Center  07/26/2023 10:15 AM Derick Seminara, Toma Aran, CNM [facility]  08/16/2023 10:55 AM Aaric Dolph, Toma Aran, CNM [facility]  08/30/2023 10:35 AM Jerene Bears, MD [facility]  09/13/2023 10:35 AM Jerene Bears, MD [facility]  09/27/2023 10:15 AM Jerene Bears, MD [facility]  10/04/2023 10:15 AM Jerene Bears, MD [facility]  10/11/2023 10:15 AM Jerene Bears, MD [facility]  10/18/2023 10:55 AM Jerene Bears, MD [facility]  10/25/2023 10:35 AM Jerene Bears, MD [facility]    Letta Kocher, CNM

## 2023-07-26 ENCOUNTER — Encounter (HOSPITAL_BASED_OUTPATIENT_CLINIC_OR_DEPARTMENT_OTHER): Payer: Self-pay | Admitting: Certified Nurse Midwife

## 2023-07-26 ENCOUNTER — Ambulatory Visit (HOSPITAL_BASED_OUTPATIENT_CLINIC_OR_DEPARTMENT_OTHER): Payer: BLUE CROSS/BLUE SHIELD | Admitting: Certified Nurse Midwife

## 2023-07-26 VITALS — BP 120/82 | HR 106 | Wt 169.0 lb

## 2023-07-26 DIAGNOSIS — Z3480 Encounter for supervision of other normal pregnancy, unspecified trimester: Secondary | ICD-10-CM | POA: Diagnosis not present

## 2023-07-26 DIAGNOSIS — Z3A27 27 weeks gestation of pregnancy: Secondary | ICD-10-CM

## 2023-07-26 NOTE — Progress Notes (Addendum)
    PRENATAL VISIT NOTE  Subjective:  Julie Clayton is a 22 y.o. G2P0010 at [redacted]w[redacted]d being seen today for ongoing prenatal care.  She is currently monitored for the following issues for this low-risk pregnancy and has Supervision of other normal pregnancy, antepartum and Depression with anxiety on their problem list.  Patient reports no bleeding, no contractions, no cramping, and no leaking.  Contractions: Not present. Vag. Bleeding: None.  Movement: Present. Denies leaking of fluid.   The following portions of the patient's history were reviewed and updated as appropriate: allergies, current medications, past family history, past medical history, past social history, past surgical history and problem list.   Objective:   Vitals:   07/26/23 1025  BP: 120/82  Pulse: (!) 106  Weight: 169 lb (76.7 kg)    Fetal Status: Fetal Heart Rate (bpm): 140 Fundal Height: 27 cm Movement: Present     General:  Alert, oriented and cooperative. Patient is in no acute distress.  Skin: Skin is warm and dry. No rash noted.   Cardiovascular: Normal heart rate noted  Respiratory: Normal respiratory effort, no problems with respiration noted  Abdomen: Soft, gravid, appropriate for gestational age.  Pain/Pressure: Absent     Pelvic: Cervical exam deferred        Extremities: Normal range of motion.  Edema: None  Mental Status: Normal mood and affect. Normal behavior. Normal judgment and thought content.   Assessment and Plan:  Pregnancy: G2P0010 at [redacted]w[redacted]d 1. Supervision of other normal pregnancy, antepartum - Glucose Tolerance, 2 Hours w/1 Hour - CBC - RPR - HIV Antibody (routine testing w rflx)  2. [redacted] weeks gestation of pregnancy  3. Depression with anxiety-Resolved  - Pt and FOB [name] no longer in a relationship. Pt found out that he was cheating. She is aware of availability STI screening. States she is doing well emotionally with good family support and does not feel like she needs SSRI at this  time.   Preterm labor symptoms and general obstetric precautions including but not limited to vaginal bleeding, contractions, leaking of fluid and fetal movement were reviewed in detail with the patient. Please refer to After Visit Summary for other counseling recommendations.   No follow-ups on file.  Future Appointments  Date Time Provider Department Center  08/16/2023 10:55 AM Leticia Coletta, Toma Aran, CNM [facility]  08/30/2023 10:35 AM Nashaun Hillmer, Toma Aran, CNM [facility]  09/13/2023 10:35 AM Jerene Bears, MD [facility]  09/27/2023 10:15 AM Jerene Bears, MD [facility]  10/04/2023 10:15 AM Jerene Bears, MD [facility]  10/11/2023 10:15 AM Jerene Bears, MD [facility]  10/18/2023 10:55 AM Jerene Bears, MD [facility]  10/25/2023 10:35 AM Yolani Vo, Toma Aran, CNM [facility]    Letta Kocher, CNM

## 2023-07-27 LAB — HIV ANTIBODY (ROUTINE TESTING W REFLEX): HIV Screen 4th Generation wRfx: NONREACTIVE

## 2023-07-27 LAB — CBC
Hematocrit: 34.5 % (ref 34.0–46.6)
Hemoglobin: 10.9 g/dL — ABNORMAL LOW (ref 11.1–15.9)
MCH: 29.1 pg (ref 26.6–33.0)
MCHC: 31.6 g/dL (ref 31.5–35.7)
MCV: 92 fL (ref 79–97)
Platelets: 226 10*3/uL (ref 150–450)
RBC: 3.75 x10E6/uL — ABNORMAL LOW (ref 3.77–5.28)
RDW: 12.9 % (ref 11.7–15.4)
WBC: 5 10*3/uL (ref 3.4–10.8)

## 2023-07-27 LAB — GLUCOSE TOLERANCE, 2 HOURS W/ 1HR
Glucose, 1 hour: 185 mg/dL — ABNORMAL HIGH (ref 70–179)
Glucose, 2 hour: 154 mg/dL — ABNORMAL HIGH (ref 70–152)
Glucose, Fasting: 131 mg/dL — ABNORMAL HIGH (ref 70–91)

## 2023-07-27 LAB — RPR: RPR Ser Ql: NONREACTIVE

## 2023-07-30 ENCOUNTER — Encounter (HOSPITAL_BASED_OUTPATIENT_CLINIC_OR_DEPARTMENT_OTHER): Payer: Self-pay | Admitting: Certified Nurse Midwife

## 2023-07-30 ENCOUNTER — Other Ambulatory Visit (HOSPITAL_BASED_OUTPATIENT_CLINIC_OR_DEPARTMENT_OTHER): Payer: Self-pay | Admitting: Certified Nurse Midwife

## 2023-07-30 ENCOUNTER — Other Ambulatory Visit (HOSPITAL_BASED_OUTPATIENT_CLINIC_OR_DEPARTMENT_OTHER): Payer: Self-pay | Admitting: *Deleted

## 2023-07-30 DIAGNOSIS — O24419 Gestational diabetes mellitus in pregnancy, unspecified control: Secondary | ICD-10-CM | POA: Insufficient documentation

## 2023-07-30 MED ORDER — BLOOD GLUCOSE TEST STRIPS 333 VI STRP
1.0000 | ORAL_STRIP | Freq: Four times a day (QID) | 1 refills | Status: DC
Start: 2023-07-30 — End: 2023-10-08

## 2023-07-30 MED ORDER — ACCU-CHEK SOFTCLIX LANCETS MISC: 1.0000 | Freq: Four times a day (QID) | 12 refills | Status: DC

## 2023-07-30 MED ORDER — ACCU-CHEK AVIVA PLUS W/DEVICE KIT: 1.0000 | PACK | Freq: Four times a day (QID) | 1 refills | Status: DC

## 2023-08-07 ENCOUNTER — Encounter: Payer: BLUE CROSS/BLUE SHIELD | Attending: Certified Nurse Midwife | Admitting: Dietician

## 2023-08-07 DIAGNOSIS — Z3A00 Weeks of gestation of pregnancy not specified: Secondary | ICD-10-CM | POA: Insufficient documentation

## 2023-08-07 DIAGNOSIS — O24419 Gestational diabetes mellitus in pregnancy, unspecified control: Secondary | ICD-10-CM | POA: Insufficient documentation

## 2023-08-07 NOTE — Progress Notes (Signed)
Patient was seen on 08/07/2023 for Gestational Diabetes self-management class at the [facility]. The following learning objectives were met by the patient during this course:  States the definition of Gestational Diabetes States why dietary management is important in controlling blood glucose Describes the effects each nutrient has on blood glucose levels Demonstrates ability to create a balanced meal plan Demonstrates carbohydrate counting  States when to check blood glucose levels Demonstrates proper blood glucose monitoring techniques States the effect of stress and exercise on blood glucose levels States the importance of limiting caffeine and abstaining from alcohol and smoking  Blood glucose monitor given: [employer] #: [id number] Exp: 03/09/2024 Blood glucose reading: 80 mg/dL reported as fasting per Pt   Patient instructed to monitor glucose levels: QID FBS: 60 - <90 1 hour: <140 2 hour: <120  *Patient received handouts: Nutrition Diabetes and Pregnancy Carbohydrate Counting List Blood glucose log Snack ideas for diabetes during pregnancy  Patient will be seen for follow-up as needed.

## 2023-08-16 ENCOUNTER — Ambulatory Visit (HOSPITAL_BASED_OUTPATIENT_CLINIC_OR_DEPARTMENT_OTHER): Payer: BLUE CROSS/BLUE SHIELD | Admitting: Certified Nurse Midwife

## 2023-08-16 VITALS — BP 134/80 | HR 103 | Wt 175.8 lb

## 2023-08-16 DIAGNOSIS — Z3A30 30 weeks gestation of pregnancy: Secondary | ICD-10-CM

## 2023-08-16 DIAGNOSIS — Z3480 Encounter for supervision of other normal pregnancy, unspecified trimester: Secondary | ICD-10-CM

## 2023-08-16 NOTE — Progress Notes (Signed)
    PRENATAL VISIT NOTE  Subjective:  Julie Clayton is a 22 y.o. G2P0010 at [redacted]w[redacted]d being seen today for ongoing prenatal care.  She is currently monitored for the following issues for this low-risk pregnancy and has Supervision of other normal pregnancy, antepartum; Depression with anxiety; and Gestational diabetes mellitus (GDM) in third trimester on their problem list.  Patient reports no bleeding, no contractions, no cramping, and no leaking.  Contractions: Not present. Vag. Bleeding: None.  Movement: Present. Denies leaking of fluid.   The following portions of the patient's history were reviewed and updated as appropriate: allergies, current medications, past family history, past medical history, past social history, past surgical history and problem list.   Objective:   Vitals:   08/16/23 1105  BP: 134/80  Pulse: (!) 103  Weight: 175 lb 12.8 oz (79.7 kg)    Fetal Status:     Movement: Present     General:  Alert, oriented and cooperative. Patient is in no acute distress.  Skin: Skin is warm and dry. No rash noted.   Cardiovascular: Normal heart rate noted  Respiratory: Normal respiratory effort, no problems with respiration noted  Abdomen: Soft, gravid, appropriate for gestational age.  Pain/Pressure: Present (Pressure (not concerning))     Pelvic: Cervical exam deferred        Extremities: Normal range of motion.  Edema: None  Mental Status: Normal mood and affect. Normal behavior. Normal judgment and thought content.   Assessment and Plan:  Pregnancy: G2P0010 at [redacted]w[redacted]d 1. [redacted] weeks gestation of pregnancy - Pt states today that she drank Snapple the morning of her 2hr fasting glucose tolerance test. She requests to repeat the Fasting 2hr GTT and will remain fasting this time. She did bring blood sugar log today and all fsbs are within goal.   2. Supervision of other normal pregnancy, antepartum   Preterm labor symptoms and general obstetric precautions including but not  limited to vaginal bleeding, contractions, leaking of fluid and fetal movement were reviewed in detail with the patient. Please refer to After Visit Summary for other counseling recommendations.   No follow-ups on file.  Future Appointments  Date Time Provider Department Center  08/30/2023 10:35 AM Syretta Kochel, Toma Aran, CNM [facility]  09/13/2023 10:35 AM Jerene Bears, MD [facility]  09/27/2023 10:15 AM Jerene Bears, MD [facility]  10/04/2023 10:15 AM Jerene Bears, MD [facility]  10/11/2023 10:15 AM Jerene Bears, MD [facility]  10/18/2023 10:55 AM Jerene Bears, MD [facility]  10/25/2023 10:35 AM Carleena Mires, Toma Aran, CNM [facility]    Letta Kocher, CNM

## 2023-08-19 ENCOUNTER — Ambulatory Visit: Payer: Medicaid Other | Admitting: Podiatry

## 2023-08-19 ENCOUNTER — Telehealth: Payer: Self-pay | Admitting: Licensed Clinical Social Worker

## 2023-08-19 NOTE — Telephone Encounter (Signed)
[facility] contacted patient on this date to provide [facility] intro and to schedule appointment. VM was left.

## 2023-08-29 ENCOUNTER — Encounter (HOSPITAL_COMMUNITY): Payer: Self-pay | Admitting: Obstetrics & Gynecology

## 2023-08-29 ENCOUNTER — Inpatient Hospital Stay (HOSPITAL_COMMUNITY)
Admission: AD | Admit: 2023-08-29 | Discharge: 2023-08-29 | Disposition: A | Discharge status: Home / Self Care | Payer: BC Managed Care – PPO | Attending: Obstetrics & Gynecology | Admitting: Obstetrics & Gynecology

## 2023-08-29 DIAGNOSIS — Z3A32 32 weeks gestation of pregnancy: Secondary | ICD-10-CM | POA: Insufficient documentation

## 2023-08-29 DIAGNOSIS — R102 Pelvic and perineal pain: Secondary | ICD-10-CM | POA: Diagnosis not present

## 2023-08-29 DIAGNOSIS — O26893 Other specified pregnancy related conditions, third trimester: Secondary | ICD-10-CM | POA: Diagnosis present

## 2023-08-29 HISTORY — DX: Other specified health status: Z78.9

## 2023-08-29 LAB — URINALYSIS, ROUTINE W REFLEX MICROSCOPIC
Bilirubin Urine: NEGATIVE
Glucose, UA: NEGATIVE mg/dL
Hgb urine dipstick: NEGATIVE
Ketones, ur: NEGATIVE mg/dL
Nitrite: NEGATIVE
Protein, ur: NEGATIVE mg/dL
Specific Gravity, Urine: 1.008 (ref 1.005–1.030)
pH: 6 (ref 5.0–8.0)

## 2023-08-29 LAB — WET PREP, GENITAL
Clue Cells Wet Prep HPF POC: NONE SEEN
Sperm: NONE SEEN
Trich, Wet Prep: NONE SEEN
WBC, Wet Prep HPF POC: >=10 — AB (ref <10)
Yeast Wet Prep HPF POC: NONE SEEN

## 2023-08-29 NOTE — MAU Note (Signed)
.  Julie Clayton is a 22 y.o. at [redacted]w[redacted]d here in MAU reporting: she thinks that she lost her mucus plug yesterday morning and also feels like "I am dilating when I sit on the toilet". She denies pain or pressure, but reports that she feels like she is opening internally. She also reports heartburn 10/10 that has been on-going for the past two days. Denies VB, LOF, abnormal discharge. Reports +FM.  LMP: N/A Onset of complaint: Yesterday Pain score: 10/10 Vitals:   08/29/23 1413  BP: 136/80  Pulse: 83  Resp: 16  Temp: 98.2 F (36.8 C)  SpO2: 100%     FHT:125 Lab orders placed from triage:  UA

## 2023-08-30 ENCOUNTER — Ambulatory Visit (HOSPITAL_BASED_OUTPATIENT_CLINIC_OR_DEPARTMENT_OTHER): Payer: BLUE CROSS/BLUE SHIELD | Admitting: Certified Nurse Midwife

## 2023-08-30 VITALS — BP 130/78 | HR 91 | Wt 174.0 lb

## 2023-08-30 DIAGNOSIS — Z3A32 32 weeks gestation of pregnancy: Secondary | ICD-10-CM

## 2023-08-30 DIAGNOSIS — Z3480 Encounter for supervision of other normal pregnancy, unspecified trimester: Secondary | ICD-10-CM

## 2023-08-30 DIAGNOSIS — O24410 Gestational diabetes mellitus in pregnancy, diet controlled: Secondary | ICD-10-CM

## 2023-08-30 LAB — GC/CHLAMYDIA PROBE AMP (~~LOC~~) NOT AT ARMC
Chlamydia: NEGATIVE
Comment: NEGATIVE
Comment: NORMAL
Neisseria Gonorrhea: NEGATIVE

## 2023-08-30 LAB — CULTURE, OB URINE: Culture: <10000 — AB

## 2023-08-30 NOTE — Progress Notes (Signed)
    PRENATAL VISIT NOTE  Subjective:  Julie Clayton is a 22 y.o. G2P0010 at [redacted]w[redacted]d being seen today for ongoing prenatal care.  She is currently monitored for the following issues for this low-risk pregnancy and has Supervision of other normal pregnancy, antepartum and Gestational diabetes mellitus (GDM) in third trimester on their problem list.  Patient reports no bleeding, no contractions, no cramping, and no leaking.  Contractions: Not present. Vag. Bleeding: None.  Movement: Present. Denies leaking of fluid.   The following portions of the patient's history were reviewed and updated as appropriate: allergies, current medications, past family history, past medical history, past social history, past surgical history and problem list.   Objective:   Vitals:   08/30/23 1049  BP: 130/78  Pulse: 91  Weight: 174 lb (78.9 kg)    Fetal Status: Fetal Heart Rate (bpm): 140 Fundal Height: 32 cm Movement: Present  Presentation: Vertex  General:  Alert, oriented and cooperative. Patient is in no acute distress.  Skin: Skin is warm and dry. No rash noted.   Cardiovascular: Normal heart rate noted  Respiratory: Normal respiratory effort, no problems with respiration noted  Abdomen: Soft, gravid, appropriate for gestational age.  Pain/Pressure: Absent     Pelvic: Cervical exam deferred        Extremities: Normal range of motion.  Edema: None  Mental Status: Normal mood and affect. Normal behavior. Normal judgment and thought content.   Assessment and Plan:  Pregnancy: G2P0010 at [redacted]w[redacted]d 1. [redacted] weeks gestation of pregnancy (Primary)  - Pt stated that she drank "Snapple" the morning of her 2hour GTT. Pt requested to repeat the 2hour GTT fasting. She did bring blood sugar log and all fsbs were within goals. Pt hasn't yet completed the 2hr GTT.  2. Supervision of other normal pregnancy, antepartum  Preterm labor symptoms and general obstetric precautions including but not limited to vaginal bleeding,  contractions, leaking of fluid and fetal movement were reviewed in detail with the patient. Please refer to After Visit Summary for other counseling recommendations.   No follow-ups on file.  Future Appointments  Date Time Provider Department Center  09/13/2023 10:35 AM Jerene Bears, MD [facility]  09/27/2023 10:15 AM Marton Redwood, Toma Aran, CNM [facility]  10/04/2023 10:15 AM Jerene Bears, MD [facility]  10/11/2023 10:15 AM Jerene Bears, MD [facility]  10/18/2023 10:55 AM Jerene Bears, MD [facility]  10/25/2023 10:35 AM Callista Hoh, Toma Aran, CNM [facility]    Letta Kocher, CNM

## 2023-09-11 NOTE — L&D Delivery Note (Signed)
OB/GYN Faculty Practice Delivery Note  Julie Clayton is a 23 y.o. G2P0010 s/p SVD at [redacted]w[redacted]d. She was admitted for IOL gHTN.   ROM: 10h 50m with clear fluid GBS Status:  Negative/-- (01/17 1302) Maximum Maternal Temperature: 98.67F  Labor Progress: Initial SVE: 0.5/thick/-3. She received cytotec, FB, AROM and Pitocin.  She then progressed to complete.   Delivery Date/Time: 0302 1/27 Delivery: Called to room and patient was complete and pushing. Head delivered LOA. No nuchal cord present. Shoulder and body delivered in usual fashion. Infant with spontaneous cry, placed on mother's abdomen, dried and stimulated. Cord clamped x 2 after 1-minute delay, and cut by MGM. Cord blood drawn. Placenta delivered spontaneously with gentle cord traction. Fundus firm with massage, lower uterine sweep, TXA and Pitocin. Labia, perineum, vagina, and cervix inspected with R labial laceration not requiring suture. Mom and baby doing well.   Baby Weight: pending  Placenta: 3 vessel, intact. Sent to L&D Complications: None Lacerations: as above EBL: 121 mL Analgesia: Epidural   Infant:  APGAR (1 MIN): 8  APGAR (5 MINS): 9   Hessie Dibble, MD [facility] Family Medicine Fellow, [facility] for [facility], [facility] 10/07/2023, 3:31 AM

## 2023-09-13 ENCOUNTER — Ambulatory Visit (HOSPITAL_BASED_OUTPATIENT_CLINIC_OR_DEPARTMENT_OTHER): Payer: BLUE CROSS/BLUE SHIELD | Admitting: Obstetrics & Gynecology

## 2023-09-13 VITALS — BP 138/80 | HR 77 | Wt 178.4 lb

## 2023-09-13 DIAGNOSIS — O24410 Gestational diabetes mellitus in pregnancy, diet controlled: Secondary | ICD-10-CM

## 2023-09-13 DIAGNOSIS — Z3480 Encounter for supervision of other normal pregnancy, unspecified trimester: Secondary | ICD-10-CM

## 2023-09-13 DIAGNOSIS — Z3A34 34 weeks gestation of pregnancy: Secondary | ICD-10-CM

## 2023-09-13 NOTE — Progress Notes (Signed)
   PRENATAL VISIT NOTE  Subjective:  Julie Clayton is a 23 y.o. G2P0010 at [redacted]w[redacted]d being seen today for ongoing prenatal care.  She is currently monitored for the following issues for this high-risk pregnancy and has Supervision of other normal pregnancy, antepartum and Gestational diabetes mellitus (GDM) in third trimester on their problem list.  Patient reports no complaints.  Contractions: Irregular. Vag. Bleeding: None.  Movement: Present. Denies leaking of fluid.   Pt drank a snapple before her GTT.  Was offered to have it repeated.  Has not done this.  Did check blood sugars and they were all normal but has not checked any since early December.  Randon finger stick glucose was 74 today.  Will check hba1c.  Pt comfortable with this.    The following portions of the patient's history were reviewed and updated as appropriate: allergies, current medications, past family history, past medical history, past social history, past surgical history and problem list.   Objective:   Vitals:   09/13/23 1142  BP: 138/80  Pulse: 77  Weight: 178 lb 6.4 oz (80.9 kg)    Fetal Status: Fetal Heart Rate (bpm): 148 Fundal Height: 34 cm Movement: Present  Presentation: Vertex  General:  Alert, oriented and cooperative. Patient is in no acute distress.  Skin: Skin is warm and dry. No rash noted.   Cardiovascular: Normal heart rate noted  Respiratory: Normal respiratory effort, no problems with respiration noted  Abdomen: Soft, gravid, appropriate for gestational age.  Pain/Pressure: Absent     Pelvic: Cervical exam deferred        Extremities: Normal range of motion.  Edema: None  Mental Status: Normal mood and affect. Normal behavior. Normal judgment and thought content.   Assessment and Plan:  Pregnancy: G2P0010 at [redacted]w[redacted]d 1. Supervision of other normal pregnancy, antepartum (Primary) - on PNV - recheck 2 weeks  2. [redacted] weeks gestation of pregnancy - Respiratory syncytial virus vaccine, preF, subunit,  bivalent,(Abrysvo)  3. Diet controlled gestational diabetes mellitus (GDM) in third trimester - random glucose 74 today - Hemoglobin A1c  Preterm labor symptoms and general obstetric precautions including but not limited to vaginal bleeding, contractions, leaking of fluid and fetal movement were reviewed in detail with the patient. Please refer to After Visit Summary for other counseling recommendations.   Return in about 2 weeks (around 09/27/2023).  Future Appointments  Date Time Provider Department Center  09/27/2023 10:15 AM Tad Arland POUR, CNM [facility]  10/04/2023 10:15 AM Cleotilde Ronal RAMAN, MD [facility]  10/11/2023 10:15 AM Cleotilde Ronal RAMAN, MD [facility]  10/18/2023 10:55 AM Cleotilde Ronal RAMAN, MD [facility]  10/25/2023 10:35 AM Lo, Arland POUR, CNM [facility]    Ronal RAMAN Cleotilde, MD

## 2023-09-14 LAB — HEMOGLOBIN A1C
Est. average glucose Bld gHb Est-mCnc: 117 mg/dL
Hgb A1c MFr Bld: 5.7 % — ABNORMAL HIGH (ref 4.8–5.6)

## 2023-09-27 ENCOUNTER — Encounter (HOSPITAL_BASED_OUTPATIENT_CLINIC_OR_DEPARTMENT_OTHER): Payer: Self-pay | Admitting: Certified Nurse Midwife

## 2023-09-27 ENCOUNTER — Other Ambulatory Visit (HOSPITAL_COMMUNITY)
Admission: RE | Admit: 2023-09-27 | Discharge: 2023-09-27 | Disposition: A | Discharge status: Home / Self Care | Payer: BLUE CROSS/BLUE SHIELD | Source: Ambulatory Visit | Attending: Obstetrics & Gynecology | Admitting: Obstetrics & Gynecology

## 2023-09-27 ENCOUNTER — Ambulatory Visit (INDEPENDENT_AMBULATORY_CARE_PROVIDER_SITE_OTHER): Payer: BC Managed Care – PPO | Admitting: Certified Nurse Midwife

## 2023-09-27 VITALS — BP 140/90 | HR 70 | Wt 182.4 lb

## 2023-09-27 DIAGNOSIS — Z3A36 36 weeks gestation of pregnancy: Secondary | ICD-10-CM

## 2023-09-27 DIAGNOSIS — Z3483 Encounter for supervision of other normal pregnancy, third trimester: Secondary | ICD-10-CM | POA: Diagnosis not present

## 2023-09-27 DIAGNOSIS — Z3480 Encounter for supervision of other normal pregnancy, unspecified trimester: Secondary | ICD-10-CM

## 2023-09-27 DIAGNOSIS — O161 Unspecified maternal hypertension, first trimester: Secondary | ICD-10-CM | POA: Diagnosis not present

## 2023-09-27 DIAGNOSIS — E611 Iron deficiency: Secondary | ICD-10-CM | POA: Diagnosis not present

## 2023-09-27 DIAGNOSIS — O169 Unspecified maternal hypertension, unspecified trimester: Secondary | ICD-10-CM

## 2023-09-27 LAB — COMPREHENSIVE METABOLIC PANEL
ALT: 8 [IU]/L (ref 0–32)
AST: 17 [IU]/L (ref 0–40)
Albumin: 3.4 g/dL — ABNORMAL LOW (ref 4.0–5.0)
Alkaline Phosphatase: 168 [IU]/L — ABNORMAL HIGH (ref 44–121)
BUN/Creatinine Ratio: 5 — ABNORMAL LOW (ref 9–23)
BUN: 2 mg/dL — ABNORMAL LOW (ref 6–20)
Bilirubin Total: 0.3 mg/dL (ref 0.0–1.2)
CO2: 20 mmol/L (ref 20–29)
Calcium: 8.6 mg/dL — ABNORMAL LOW (ref 8.7–10.2)
Chloride: 102 mmol/L (ref 96–106)
Creatinine, Ser: 0.44 mg/dL — ABNORMAL LOW (ref 0.57–1.00)
Globulin, Total: 3.2 g/dL (ref 1.5–4.5)
Glucose: 84 mg/dL (ref 70–99)
Potassium: 3.7 mmol/L (ref 3.5–5.2)
Sodium: 136 mmol/L (ref 134–144)
Total Protein: 6.6 g/dL (ref 6.0–8.5)
eGFR: 140 mL/min/{1.73_m2} (ref >59)

## 2023-09-27 LAB — CBC
Hematocrit: 31.2 % — ABNORMAL LOW (ref 34.0–46.6)
Hemoglobin: 9.8 g/dL — ABNORMAL LOW (ref 11.1–15.9)
MCH: 26.5 pg — ABNORMAL LOW (ref 26.6–33.0)
MCHC: 31.4 g/dL — ABNORMAL LOW (ref 31.5–35.7)
MCV: 84 fL (ref 79–97)
Platelets: 231 10*3/uL (ref 150–450)
RBC: 3.7 x10E6/uL — ABNORMAL LOW (ref 3.77–5.28)
RDW: 13.3 % (ref 11.7–15.4)
WBC: 7 10*3/uL (ref 3.4–10.8)

## 2023-09-27 NOTE — Progress Notes (Signed)
PRENATAL VISIT NOTE  Blood sugar log scanned in under "Media" 09/27/23.  Subjective:  Julie Clayton is a 23 y.o. G2P0010 at [redacted]w[redacted]d being seen today for ongoing prenatal care.  She is currently monitored for the following issues for this  pregnancy and has Supervision of other normal pregnancy, antepartum; Gestational diabetes mellitus (GDM) in third trimester; and Elevated blood pressure affecting pregnancy, antepartum on their problem list. Pt did not pass her 2hr GTT (131/185/154) but after testing reported that she was not fasting that morning. She declined to repeat the 2 hour fasting GTT, but has checked fingerstick blood sugars. She started checking 11/27 and checked QID through 12/8 (all were normal). Dr. Hyacinth Meeker had her restart checking 1/3. No evidence of gestational diabetes (pt eating regular diet). FSBS to be scanned to Media.   Patient reports no bleeding, no contractions, no cramping, no leaking, and denies headache, denies vision changes, denies edema. States she did not sleep well last night and feels tired today.Did have 1/2 cup coffee this morning .  Contractions: Not present. Vag. Bleeding: None.  Movement: Present. Denies leaking of fluid.   The following portions of the patient's history were reviewed and updated as appropriate: allergies, current medications, past family history, past medical history, past social history, past surgical history and problem list.   Objective:   Vitals:   09/27/23 1041  BP: (!) 140/90  Weight: 182 lb 6.4 oz (82.7 kg)    Fetal Status: Fetal Heart Rate (bpm): 147 Fundal Height: 37 cm Movement: Present  Presentation: Vertex  General:  Alert, oriented and cooperative. Patient is in no acute distress.  Skin: Skin is warm and dry. No rash noted.   Cardiovascular: Normal heart rate noted  Respiratory: Normal respiratory effort, no problems with respiration noted  Abdomen: Soft, gravid, appropriate for gestational age.  Pain/Pressure: Present  (Pressure)     Pelvic: Cervical exam deferred        Extremities: Normal range of motion.  Edema: None  Mental Status: Normal mood and affect. Normal behavior. Normal judgment and thought content.   Assessment and Plan:  Pregnancy: G2P0010 at [redacted]w[redacted]d  1. Supervision of other normal pregnancy, antepartum (Primary) - Pt brought blood sugar log, blood sugars reviewed from 1/3 to 1/17: All fastings <90 with the exception of one isolated 94. All 2 hour postprandials <120 with 2 exceptions 120, 122). There is no evidence of gestational diabetes.  - GBS and GC/CT collected-routine  2. [redacted] weeks gestation of pregnancy - Culture, beta strep (group b only) - Cervicovaginal ancillary only  3. Elevated blood pressure affecting pregnancy in first trimester, antepartum - Pt will continue to check BP at home at least daily - RTO 3 days for follow-up RPV and BP Check - Protein / creatinine ratio, urine (Monday)  4. Elevated blood pressure affecting pregnancy, antepartum - Educated pt on s/s PreE and symptoms to report including headache, vision changes, RUQ or epigastric pain, new onset swelling or new onset nausea or vomiting - CBC - Comp Met (CMET)  Preterm labor symptoms and general obstetric precautions including but not limited to vaginal bleeding, contractions, leaking of fluid and fetal movement were reviewed in detail with the patient. Please refer to After Visit Summary for other counseling recommendations.   Return for Monday RPV -must be seen monday.  Future Appointments  Date Time Provider Department Center  10/04/2023 10:15 AM Jerene Bears, MD [facility]  10/11/2023 10:15 AM Jerene Bears, MD [facility]  10/18/2023 10:55 AM Arthi Mcdonald, Toma Aran, CNM [facility]  10/25/2023 10:35 AM Cherelle Midkiff, Toma Aran, CNM [facility]    Letta Kocher, CNM

## 2023-09-30 ENCOUNTER — Ambulatory Visit (HOSPITAL_BASED_OUTPATIENT_CLINIC_OR_DEPARTMENT_OTHER): Payer: BLUE CROSS/BLUE SHIELD

## 2023-09-30 ENCOUNTER — Encounter (HOSPITAL_BASED_OUTPATIENT_CLINIC_OR_DEPARTMENT_OTHER): Payer: Self-pay | Admitting: Certified Nurse Midwife

## 2023-09-30 VITALS — BP 138/76 | HR 76 | Wt 183.6 lb

## 2023-09-30 DIAGNOSIS — O24419 Gestational diabetes mellitus in pregnancy, unspecified control: Secondary | ICD-10-CM

## 2023-09-30 LAB — CERVICOVAGINAL ANCILLARY ONLY
Chlamydia: NEGATIVE
Comment: NEGATIVE
Comment: NORMAL
Neisseria Gonorrhea: NEGATIVE

## 2023-09-30 MED ORDER — ACCRUFER 30 MG PO CAPS: 1.0000 | ORAL_CAPSULE | Freq: Every day | ORAL | 2 refills | Status: AC

## 2023-09-30 NOTE — Progress Notes (Signed)
Pt presented to office for bp check. Bp was taken and urine sample obtained. Bp read normal and I also obtained fetal heartbeat which was 155.

## 2023-09-30 NOTE — Addendum Note (Signed)
Addended byMerrilee Jansky on: 09/30/2023 08:42 AM   Modules accepted: Orders

## 2023-10-01 LAB — PROTEIN / CREATININE RATIO, URINE
Creatinine, Urine: 157.8 mg/dL
Protein, Ur: 23.7 mg/dL
Protein/Creat Ratio: 150 mg/g{creat} (ref 0–200)

## 2023-10-01 LAB — CULTURE, BETA STREP (GROUP B ONLY): Strep Gp B Culture: NEGATIVE

## 2023-10-02 ENCOUNTER — Encounter (HOSPITAL_BASED_OUTPATIENT_CLINIC_OR_DEPARTMENT_OTHER): Payer: Self-pay | Admitting: *Deleted

## 2023-10-03 ENCOUNTER — Telehealth (HOSPITAL_BASED_OUTPATIENT_CLINIC_OR_DEPARTMENT_OTHER): Payer: Self-pay

## 2023-10-03 NOTE — Telephone Encounter (Signed)
Attempted to call pt in regards to appt for tomorrow. Left vm for pt.

## 2023-10-03 NOTE — Addendum Note (Signed)
Addended by: Hendricks Milo on: 10/03/2023 04:33 PM   Modules accepted: Orders

## 2023-10-04 ENCOUNTER — Encounter (HOSPITAL_BASED_OUTPATIENT_CLINIC_OR_DEPARTMENT_OTHER): Payer: Self-pay | Admitting: Certified Nurse Midwife

## 2023-10-04 ENCOUNTER — Ambulatory Visit (INDEPENDENT_AMBULATORY_CARE_PROVIDER_SITE_OTHER): Payer: BLUE CROSS/BLUE SHIELD | Admitting: Certified Nurse Midwife

## 2023-10-04 VITALS — BP 136/95 | HR 82 | Wt 186.8 lb

## 2023-10-04 DIAGNOSIS — O139 Gestational [pregnancy-induced] hypertension without significant proteinuria, unspecified trimester: Secondary | ICD-10-CM | POA: Insufficient documentation

## 2023-10-04 DIAGNOSIS — Z3A37 37 weeks gestation of pregnancy: Secondary | ICD-10-CM

## 2023-10-04 DIAGNOSIS — O24410 Gestational diabetes mellitus in pregnancy, diet controlled: Secondary | ICD-10-CM

## 2023-10-04 DIAGNOSIS — O133 Gestational [pregnancy-induced] hypertension without significant proteinuria, third trimester: Secondary | ICD-10-CM

## 2023-10-04 DIAGNOSIS — Z3480 Encounter for supervision of other normal pregnancy, unspecified trimester: Secondary | ICD-10-CM

## 2023-10-04 NOTE — Progress Notes (Signed)
    PRENATAL VISIT NOTE  Subjective:  Julie Clayton is a 23 y.o. G2P0010 at [redacted]w[redacted]d being seen today for ongoing prenatal care.  She is currently monitored for the following issues for this high-risk pregnancy and has Supervision of other normal pregnancy, antepartum; Gestational diabetes mellitus (GDM) in third trimester; Elevated blood pressure affecting pregnancy, antepartum; and Gestational hypertension on their problem list.  Patient reports  : denies headache, denies swelling, denies vision changes, RUQ or epigastric pain. No nausea or vomiting .  Contractions: Not present. Vag. Bleeding: None.  Movement: Present. Denies leaking of fluid.   The following portions of the patient's history were reviewed and updated as appropriate: allergies, current medications, past family history, past medical history, past social history, past surgical history and problem list.   Objective:   Vitals:   10/04/23 1029 10/04/23 1115  BP: (!) 146/73 (!) 136/95  Pulse: 82   Weight: 186 lb 12.8 oz (84.7 kg)     Fetal Status: Fetal Heart Rate (bpm): 150 Fundal Height: 37 cm Movement: Present     General:  Alert, oriented and cooperative. Patient is in no acute distress.  Skin: Skin is warm and dry. No rash noted.   Cardiovascular: Normal heart rate noted  Respiratory: Normal respiratory effort, no problems with respiration noted  Abdomen: Soft, gravid, appropriate for gestational age.  Pain/Pressure: Absent     Pelvic: Cervical exam deferred        Extremities: Normal range of motion.  Edema: Trace  Mental Status: Normal mood and affect. Normal behavior. Normal judgment and thought content.   Assessment and Plan:  Pregnancy: G2P0010 at [redacted]w[redacted]d 1. Gestational hypertension, third trimester (Primary) - Pt has met criteria for Gestational Hypertension, plan Medical IOL at 37 weeks (scheduled for Sunday 10/06/23). - CBC - Comp Met (CMET)  2. [redacted] weeks gestation of pregnancy - GBS Neg, GC/CT Neg  3.  Supervision of other normal pregnancy, antepartum   4. Diet controlled gestational diabetes mellitus (GDM) in third trimester - Pt states all fingerstick blood sugars within goal of <90 fasting and <120 at 2hours postprandial  Term labor symptoms and general obstetric precautions including but not limited to vaginal bleeding, contractions, leaking of fluid and fetal movement were reviewed in detail with the patient. Please refer to After Visit Summary for other counseling recommendations.   No follow-ups on file.  Future Appointments  Date Time Provider Department Center  10/06/2023  6:30 AM [facility] SCHED ROOM [facility] None  10/11/2023 10:15 AM Jerene Bears, MD [facility]  10/18/2023 10:55 AM [place] Sorn, Toma Aran, CNM [facility]  10/25/2023 10:35 AM Lamone Ferrelli, Toma Aran, CNM [facility]    Letta Kocher, CNM

## 2023-10-05 LAB — COMPREHENSIVE METABOLIC PANEL
ALT: 9 [IU]/L (ref 0–32)
AST: 17 [IU]/L (ref 0–40)
Albumin: 3.4 g/dL — ABNORMAL LOW (ref 4.0–5.0)
Alkaline Phosphatase: 177 [IU]/L — ABNORMAL HIGH (ref 44–121)
BUN/Creatinine Ratio: 4 — ABNORMAL LOW (ref 9–23)
BUN: 2 mg/dL — ABNORMAL LOW (ref 6–20)
Bilirubin Total: 0.4 mg/dL (ref 0.0–1.2)
CO2: 21 mmol/L (ref 20–29)
Calcium: 8.9 mg/dL (ref 8.7–10.2)
Chloride: 102 mmol/L (ref 96–106)
Creatinine, Ser: 0.55 mg/dL — ABNORMAL LOW (ref 0.57–1.00)
Globulin, Total: 3 g/dL (ref 1.5–4.5)
Glucose: 80 mg/dL (ref 70–99)
Potassium: 4.3 mmol/L (ref 3.5–5.2)
Sodium: 136 mmol/L (ref 134–144)
Total Protein: 6.4 g/dL (ref 6.0–8.5)
eGFR: 133 mL/min/{1.73_m2} (ref >59)

## 2023-10-05 LAB — CBC
Hematocrit: 31.7 % — ABNORMAL LOW (ref 34.0–46.6)
Hemoglobin: 9.7 g/dL — ABNORMAL LOW (ref 11.1–15.9)
MCH: 25.9 pg — ABNORMAL LOW (ref 26.6–33.0)
MCHC: 30.6 g/dL — ABNORMAL LOW (ref 31.5–35.7)
MCV: 85 fL (ref 79–97)
Platelets: 246 10*3/uL (ref 150–450)
RBC: 3.75 x10E6/uL — ABNORMAL LOW (ref 3.77–5.28)
RDW: 13.4 % (ref 11.7–15.4)
WBC: 6.6 10*3/uL (ref 3.4–10.8)

## 2023-10-06 ENCOUNTER — Inpatient Hospital Stay (HOSPITAL_COMMUNITY)
Admission: RE | Admit: 2023-10-06 | Discharge: 2023-10-08 | DRG: 807 | Disposition: A | Discharge status: Home / Self Care | Payer: BLUE CROSS/BLUE SHIELD | Attending: Obstetrics & Gynecology | Admitting: Obstetrics & Gynecology

## 2023-10-06 ENCOUNTER — Encounter (HOSPITAL_COMMUNITY): Payer: Self-pay | Admitting: Family Medicine

## 2023-10-06 ENCOUNTER — Inpatient Hospital Stay (HOSPITAL_COMMUNITY): Payer: BLUE CROSS/BLUE SHIELD

## 2023-10-06 ENCOUNTER — Inpatient Hospital Stay (HOSPITAL_COMMUNITY): Payer: BC Managed Care – PPO | Admitting: Anesthesiology

## 2023-10-06 ENCOUNTER — Other Ambulatory Visit: Payer: Self-pay

## 2023-10-06 DIAGNOSIS — O134 Gestational [pregnancy-induced] hypertension without significant proteinuria, complicating childbirth: Principal | ICD-10-CM | POA: Diagnosis present

## 2023-10-06 DIAGNOSIS — Z833 Family history of diabetes mellitus: Secondary | ICD-10-CM | POA: Diagnosis not present

## 2023-10-06 DIAGNOSIS — O133 Gestational [pregnancy-induced] hypertension without significant proteinuria, third trimester: Secondary | ICD-10-CM

## 2023-10-06 DIAGNOSIS — Z3A37 37 weeks gestation of pregnancy: Secondary | ICD-10-CM

## 2023-10-06 DIAGNOSIS — O24420 Gestational diabetes mellitus in childbirth, diet controlled: Secondary | ICD-10-CM | POA: Diagnosis present

## 2023-10-06 DIAGNOSIS — O139 Gestational [pregnancy-induced] hypertension without significant proteinuria, unspecified trimester: Principal | ICD-10-CM | POA: Diagnosis present

## 2023-10-06 DIAGNOSIS — Z8249 Family history of ischemic heart disease and other diseases of the circulatory system: Secondary | ICD-10-CM | POA: Diagnosis not present

## 2023-10-06 DIAGNOSIS — O26893 Other specified pregnancy related conditions, third trimester: Secondary | ICD-10-CM | POA: Diagnosis present

## 2023-10-06 DIAGNOSIS — O24419 Gestational diabetes mellitus in pregnancy, unspecified control: Secondary | ICD-10-CM | POA: Diagnosis present

## 2023-10-06 LAB — COMPREHENSIVE METABOLIC PANEL
ALT: 12 U/L (ref 0–44)
AST: 21 U/L (ref 15–41)
Albumin: 2.6 g/dL — ABNORMAL LOW (ref 3.5–5.0)
Alkaline Phosphatase: 148 U/L — ABNORMAL HIGH (ref 38–126)
Anion gap: 12 (ref 5–15)
BUN: <5 mg/dL — ABNORMAL LOW (ref 6–20)
CO2: 22 mmol/L (ref 22–32)
Calcium: 8.6 mg/dL — ABNORMAL LOW (ref 8.9–10.3)
Chloride: 104 mmol/L (ref 98–111)
Creatinine, Ser: 0.56 mg/dL (ref 0.44–1.00)
GFR, Estimated: >60 mL/min (ref >60)
Glucose, Bld: 83 mg/dL (ref 70–99)
Potassium: 3.9 mmol/L (ref 3.5–5.1)
Sodium: 138 mmol/L (ref 135–145)
Total Bilirubin: 0.6 mg/dL (ref 0.0–1.2)
Total Protein: 6.3 g/dL — ABNORMAL LOW (ref 6.5–8.1)

## 2023-10-06 LAB — CBC
HCT: 29.2 % — ABNORMAL LOW (ref 36.0–46.0)
HCT: 33.7 % — ABNORMAL LOW (ref 36.0–46.0)
Hemoglobin: 9.2 g/dL — ABNORMAL LOW (ref 12.0–15.0)
Hemoglobin: 10.5 g/dL — ABNORMAL LOW (ref 12.0–15.0)
MCH: 26.2 pg (ref 26.0–34.0)
MCH: 25.7 pg — ABNORMAL LOW (ref 26.0–34.0)
MCHC: 31.5 g/dL (ref 30.0–36.0)
MCHC: 31.2 g/dL (ref 30.0–36.0)
MCV: 83.2 fL (ref 80.0–100.0)
MCV: 82.4 fL (ref 80.0–100.0)
Platelets: 227 10*3/uL (ref 150–400)
Platelets: 246 10*3/uL (ref 150–400)
RBC: 3.51 MIL/uL — ABNORMAL LOW (ref 3.87–5.11)
RBC: 4.09 MIL/uL (ref 3.87–5.11)
RDW: 13.8 % (ref 11.5–15.5)
RDW: 13.8 % (ref 11.5–15.5)
WBC: 6.2 10*3/uL (ref 4.0–10.5)
WBC: 9.6 10*3/uL (ref 4.0–10.5)
nRBC: 0 % (ref 0.0–0.2)
nRBC: 0 % (ref 0.0–0.2)

## 2023-10-06 LAB — GLUCOSE, CAPILLARY
Glucose-Capillary: 74 mg/dL (ref 70–99)
Glucose-Capillary: 69 mg/dL — ABNORMAL LOW (ref 70–99)
Glucose-Capillary: 82 mg/dL (ref 70–99)
Glucose-Capillary: 66 mg/dL — ABNORMAL LOW (ref 70–99)
Glucose-Capillary: 68 mg/dL — ABNORMAL LOW (ref 70–99)
Glucose-Capillary: 71 mg/dL (ref 70–99)

## 2023-10-06 LAB — TYPE AND SCREEN
ABO/RH(D): A POS
Antibody Screen: NEGATIVE

## 2023-10-06 LAB — PROTEIN / CREATININE RATIO, URINE
Creatinine, Urine: 13 mg/dL
Total Protein, Urine: <6 mg/dL

## 2023-10-06 MED ORDER — PHENYLEPHRINE 80 MCG/ML (10ML) SYRINGE FOR IV PUSH (FOR BLOOD PRESSURE SUPPORT)
80.0000 ug | PREFILLED_SYRINGE | INTRAVENOUS | Status: DC | PRN
  Filled 2023-10-06: qty 10

## 2023-10-06 MED ORDER — EPHEDRINE 5 MG/ML INJ: 10.0000 mg | INTRAVENOUS | Status: DC | PRN

## 2023-10-06 MED ORDER — MISOPROSTOL 50MCG HALF TABLET: 50.0000 ug | ORAL_TABLET | Freq: Once | ORAL | Status: DC

## 2023-10-06 MED ORDER — OXYTOCIN-SODIUM CHLORIDE 30-0.9 UT/500ML-% IV SOLN
2.5000 [IU]/h | INTRAVENOUS | Status: DC
  Administered 2023-10-07: 2.5 [IU]/h via INTRAVENOUS
  Filled 2023-10-06: qty 500

## 2023-10-06 MED ORDER — DIPHENHYDRAMINE HCL 50 MG/ML IJ SOLN: 12.5000 mg | INTRAMUSCULAR | Status: DC | PRN

## 2023-10-06 MED ORDER — TERBUTALINE SULFATE 1 MG/ML IJ SOLN
0.2500 mg | Freq: Once | INTRAMUSCULAR | Status: AC | PRN
  Administered 2023-10-06: 0.25 mg via SUBCUTANEOUS
  Filled 2023-10-06: qty 1

## 2023-10-06 MED ORDER — FENTANYL CITRATE (PF) 100 MCG/2ML IJ SOLN
50.0000 ug | INTRAMUSCULAR | Status: DC | PRN
Start: 2023-10-06 — End: 2023-10-08
  Administered 2023-10-06 (×2): 100 ug via INTRAVENOUS
  Filled 2023-10-06 (×2): qty 2

## 2023-10-06 MED ORDER — ACETAMINOPHEN 325 MG PO TABS: 650.0000 mg | ORAL_TABLET | ORAL | Status: DC | PRN

## 2023-10-06 MED ORDER — OXYCODONE-ACETAMINOPHEN 5-325 MG PO TABS: 1.0000 | ORAL_TABLET | ORAL | Status: DC | PRN

## 2023-10-06 MED ORDER — FENTANYL-BUPIVACAINE-NACL 0.5-0.125-0.9 MG/250ML-% EP SOLN
12.0000 mL/h | EPIDURAL | Status: DC | PRN
  Administered 2023-10-06: 12 mL/h via EPIDURAL
  Filled 2023-10-06: qty 250

## 2023-10-06 MED ORDER — LIDOCAINE HCL (PF) 1 % IJ SOLN
INTRAMUSCULAR | Status: DC | PRN
  Administered 2023-10-06 (×2): 4 mL via EPIDURAL

## 2023-10-06 MED ORDER — OXYTOCIN BOLUS FROM INFUSION
333.0000 mL | Freq: Once | INTRAVENOUS | Status: AC
  Administered 2023-10-07: 333 mL via INTRAVENOUS

## 2023-10-06 MED ORDER — LACTATED RINGERS IV SOLN: 500.0000 mL | Freq: Once | INTRAVENOUS | Status: DC

## 2023-10-06 MED ORDER — ONDANSETRON HCL 4 MG/2ML IJ SOLN
4.0000 mg | Freq: Four times a day (QID) | INTRAMUSCULAR | Status: DC | PRN
  Administered 2023-10-06: 4 mg via INTRAVENOUS
  Filled 2023-10-06: qty 2

## 2023-10-06 MED ORDER — LACTATED RINGERS IV SOLN: 500.0000 mL | INTRAVENOUS | Status: DC | PRN

## 2023-10-06 MED ORDER — OXYCODONE-ACETAMINOPHEN 5-325 MG PO TABS: 2.0000 | ORAL_TABLET | ORAL | Status: DC | PRN

## 2023-10-06 MED ORDER — MISOPROSTOL 50MCG HALF TABLET
50.0000 ug | ORAL_TABLET | ORAL | Status: AC
Start: 2023-10-06 — End: 2023-10-06
  Administered 2023-10-06: 50 ug via BUCCAL
  Filled 2023-10-06: qty 1

## 2023-10-06 MED ORDER — LACTATED RINGERS IV SOLN: INTRAVENOUS | Status: DC

## 2023-10-06 MED ORDER — FLEET ENEMA RE ENEM: 1.0000 | ENEMA | Freq: Every day | RECTAL | Status: DC | PRN

## 2023-10-06 MED ORDER — LIDOCAINE HCL (PF) 1 % IJ SOLN: 30.0000 mL | INTRAMUSCULAR | Status: DC | PRN

## 2023-10-06 MED ORDER — MISOPROSTOL 25 MCG QUARTER TABLET
25.0000 ug | ORAL_TABLET | ORAL | Status: DC
  Administered 2023-10-06 (×2): 25 ug via VAGINAL
  Filled 2023-10-06 (×2): qty 1

## 2023-10-06 MED ORDER — SOD CITRATE-CITRIC ACID 500-334 MG/5ML PO SOLN: 30.0000 mL | ORAL | Status: DC | PRN

## 2023-10-06 NOTE — Progress Notes (Addendum)
LABOR PROGRESS NOTE  Julie Clayton is a 23 y.o. G2P0010 at [redacted]w[redacted]d admitted for IOL for gHTN and A1GDM.  Subjective: Patient laboring in room, tolerating pain from contractions better with epidural. Family present in room, no concerns or needs at this time.  Objective: BP 137/64   Pulse (!) 59   Temp 98.1 F (36.7 C) (Oral)   Resp 17   Ht 5\' 4"  (1.626 m)   Wt 86.3 kg   LMP 01/16/2023 Comment: elective abortion November 09, 2022  BMI 32.66 kg/m  or  Vitals:   10/06/23 2116 10/06/23 2118 10/06/23 2121 10/06/23 2141  BP: (!) 146/84 (!) 146/84 (!) 146/68 137/64  Pulse: 94 94 64 (!) 59  Resp:      Temp:      TempSrc:      Weight:      Height:       Dilation: 7 Effacement (%): 80 Station: -1, 0 Presentation: Vertex Exam by:: Quin Hoop CNM  FHT Baseline: 130 bpm Variability: Good (> 6 bpm) Accelerations: Reactive Decelerations: Early Uterine activity: Frequency: Every 2-3 minutes, couplets Cat: I  Labs: Lab Results  Component Value Date   WBC 9.6 10/06/2023   HGB 10.5 (L) 10/06/2023   HCT 33.7 (L) 10/06/2023   MCV 82.4 10/06/2023   PLT 246 10/06/2023   Patient Active Problem List   Diagnosis Date Noted   Gestational hypertension 10/04/2023   Elevated blood pressure affecting pregnancy, antepartum 09/27/2023   Gestational diabetes mellitus (GDM) in third trimester 07/30/2023   Supervision of other normal pregnancy, antepartum 04/01/2023   Assessment / Plan: 23 y.o. G2P0010 at [redacted]w[redacted]d here for IOL for gHTN and A1GDM.  Labor: IOL s/p Cytotec x2, FB, AROM, epidural. Progressing well. Fetal Wellbeing: Category 1 gHTN: UPC neg // 227 //  21/12, mild to moderate range pressures, no severe features A1GDM: last checks within 60s, cut off changed to <60 and spaced CBGs to Qshift of if symptomatic, continue hypoglycemic protocol Pain Control: nitrous oxide, epidural GBS: neg Anticipated MOD: NSVD  Estrella Deeds, Medical Student  10/06/2023, 10:10 PM   Evaluation and  management procedures were performed by the MS3 under my supervision. I was immediately available for direct supervision, assistance and direction throughout this encounter.  I also confirm that I have verified the information documented in the student's note, and that I have also personally reperformed the pertinent components of the physical exam and all of the medical decision making activities.  I have also made any necessary editorial changes.   Mittie Bodo, MD Family Medicine - Obstetrics Fellow

## 2023-10-06 NOTE — Anesthesia Procedure Notes (Signed)
Epidural Patient location during procedure: OB Start time: 10/06/2023 9:12 PM End time: 10/06/2023 9:15 PM  Staffing Anesthesiologist: Kaylyn Layer, MD Performed: anesthesiologist   Preanesthetic Checklist Completed: patient identified, IV checked, risks and benefits discussed, monitors and equipment checked, pre-op evaluation and timeout performed  Epidural Patient position: sitting Prep: DuraPrep and site prepped and draped Patient monitoring: continuous pulse ox, blood pressure and heart rate Approach: midline Location: L3-L4 Injection technique: LOR air  Needle:  Needle type: Tuohy  Needle gauge: 17 G Needle length: 9 cm Needle insertion depth: 6 cm Catheter type: closed end flexible Catheter size: 19 Gauge Catheter at skin depth: 11 cm Test dose: negative and Other (1% lidocaine)  Assessment Events: blood not aspirated, no cerebrospinal fluid, injection not painful, no injection resistance, no paresthesia and negative IV test  Additional Notes Patient identified. Risks, benefits, and alternatives discussed with patient including but not limited to bleeding, infection, nerve damage, paralysis, failed block, incomplete pain control, headache, blood pressure changes, nausea, vomiting, reactions to medication, itching, and postpartum back pain. Confirmed with bedside nurse the patient's most recent platelet count. Confirmed with patient that they are not currently taking any anticoagulation, have any bleeding history, or any family history of bleeding disorders. Patient expressed understanding and wished to proceed. All questions were answered. Sterile technique was used throughout the entire procedure. Please see nursing notes for vital signs.   Crisp LOR on first pass. Test dose was given through epidural catheter and negative prior to continuing to dose epidural or start infusion. Warning signs of high block given to the patient including shortness of breath,  tingling/numbness in hands, complete motor block, or any concerning symptoms with instructions to call for help. Patient was given instructions on fall risk and not to get out of bed. All questions and concerns addressed with instructions to call with any issues or inadequate analgesia.  Reason for block:procedure for pain

## 2023-10-06 NOTE — Progress Notes (Signed)
  Subjective: Patient laboring in the room with Nitrous Oxide. Tolerating well with adjustment to proper use.   Objective: BP (!) 159/84   Pulse 72   Temp 98.2 F (36.8 C) (Oral)   Resp 18   Ht 5\' 4"  (1.626 m)   Wt 86.3 kg   LMP 01/16/2023 Comment: elective abortion November 09, 2022  BMI 32.66 kg/m     FHT:  FHR: 135 bpm, variability: moderate,  accelerations:  Present,  decelerations:  Absent UC:   regular, every 1-4 minutes SVE:   Dilation: 6.5 Effacement (%): 70 Station: -2 Exam by:: Quin Hoop CNM  Labs: Lab Results  Component Value Date   WBC 9.6 10/06/2023   HGB 10.5 (L) 10/06/2023   HCT 33.7 (L) 10/06/2023   MCV 82.4 10/06/2023   PLT 246 10/06/2023    Assessment / Plan: [redacted]w[redacted]d IUP IOL for gHTN and A1GDM. Progressing well. Bps stable. Cat 1 tracing.  Labor: IOL s/p Cytotec, FB, AROM. Progressing well. gHTN: Bps stable, no severe feature Fetal Wellbeing:  Category I Pain Control:  Nitrous Oxide I/D:  n/a Anticipated MOD:  NSVD  Elige Ko, Student-MidWife 10/06/2023, 7:26 PM

## 2023-10-06 NOTE — Progress Notes (Addendum)
Subjective:  Patient up on birthing ball, tolerating uterine contractions. She reports vaginal spotting, no leaking of fluid.   Objective: BP (!) 149/91   Pulse 79   Temp 98 F (36.7 C) (Oral)   Resp 18   Ht 5\' 4"  (1.626 m)   Wt 86.3 kg   LMP 01/16/2023 Comment: elective abortion November 09, 2022  BMI 32.66 kg/m     FHT:  FHR: 145 bpm, variability: moderate,  accelerations:  Present,  decelerations:  Absent UC:   regular, every 3-4 minutes SVE:   Dilation: 1.5 Effacement (%): Thick Station: -3 Exam by:: Katrinka Blazing, CNM  Labs: Lab Results  Component Value Date   WBC 6.2 10/06/2023   HGB 9.2 (L) 10/06/2023   HCT 29.2 (L) 10/06/2023   MCV 83.2 10/06/2023   PLT 227 10/06/2023    Assessment / Plan: Induction of labor due to gestational hypertension. Progressing well on cytotec.  Labor:Foley balloon placed. Cytotec. GHT: BP labs stable Fetal Wellbeing:  Category I Pain Control:  Labor support with IV pain medication PRN I/D:  GBS neg Anticipated MOD:  NSVD  Elige Ko, Student-MidWife 10/06/2023, 2:50 PM  I was present for the exam and agree with above.  Katrinka Blazing, [place] 10/06/2023 4:03 PM

## 2023-10-06 NOTE — H&P (Addendum)
OBSTETRIC ADMISSION HISTORY AND PHYSICAL  Julie Clayton is a 23 y.o. female G2P0010 with IUP at [redacted]w[redacted]d by [place] presenting for IOL. She reports +FMs, No LOF, no VB, no blurry vision, headaches, peripheral edema, or RUQ pain.  She plans on breast and bottle feeding. She is undecided about birth control but leaning towards IUD. She received her prenatal care at  [facility]    Dating: By [place] --->  Estimated Date of Delivery: 10/23/23  Sono:    @[redacted]w[redacted]d , CWD, normal anatomy, cephalic presentation, Placenta- anterior, 293g, 55% EFW   Prenatal History/Complications: A1GDM and gHTN  Past Medical History: Past Medical History:  Diagnosis Date   Medical history non-contributory     Past Surgical History: Past Surgical History:  Procedure Laterality Date   DILATION AND EVACUATION      Obstetrical History: OB History     Gravida  2   Para      Term      Preterm      AB  1   Living         SAB      IAB  1   Ectopic      Multiple      Live Births              Social History Social History   Socioeconomic History   Marital status: Single    Spouse name: Not on file   Number of children: Not on file   Years of education: Not on file   Highest education level: Not on file  Occupational History   Not on file  Tobacco Use   Smoking status: Never   Smokeless tobacco: Never  Vaping Use   Vaping status: Never Used  Substance and Sexual Activity   Alcohol use: Not Currently   Drug use: No   Sexual activity: Not Currently    Birth control/protection: None  Other Topics Concern   Not on file  Social History Narrative   Not on file   Social Drivers of Health   Financial Resource Strain: Low Risk  (04/01/2023)   Overall Financial Resource Strain (CARDIA)    Difficulty of Paying Living Expenses: Not very hard  Food Insecurity: No Food Insecurity (10/06/2023)   Hunger Vital Sign    Worried About Running Out of Food in the Last Year: Never true    Ran Out of Food in the  Last Year: Never true  Transportation Needs: No Transportation Needs (10/06/2023)   PRAPARE - Administrator, Civil Service (Medical): No    Lack of Transportation (Non-Medical): No  Physical Activity: Inactive (04/01/2023)   Exercise Vital Sign    Days of Exercise per Week: 0 days    Minutes of Exercise per Session: 0 min  Stress: No Stress Concern Present (04/01/2023)   [employer] of Occupational Health - Occupational Stress Questionnaire    Feeling of Stress : Only a little  Social Connections: Moderately Isolated (04/01/2023)   Social Connection and Isolation Panel [NHANES]    Frequency of Communication with Friends and Family: More than three times a week    Frequency of Social Gatherings with Friends and Family: Twice a week    Attends Religious Services: 1 to 4 times per year    Active Member of [employer] or Organizations: No    Attends Banker Meetings: Never    Marital Status: Never married    Family History: Family History  Problem Relation Age of Onset  Asthma Mother    Hypertension Father    Asthma Brother    Diabetes Maternal Grandmother    Cancer Maternal Grandfather        lung   Alopecia Paternal Grandmother     Allergies: No Known Allergies  Medications Prior to Admission  Medication Sig Dispense Refill Last Dose/Taking   Accu-Chek Softclix Lancets lancets 1 each by Other route 4 (four) times daily. Use as instructed (Patient not taking: Reported on 08/07/2023) 100 each 12    Blood Glucose Monitoring Suppl (ACCU-CHEK AVIVA PLUS) w/Device KIT 1 each by Does not apply route in the morning, at noon, in the evening, and at bedtime. (Patient not taking: Reported on 08/07/2023) 1 kit 1    Ferric Maltol (ACCRUFER) 30 MG CAPS Take 1 capsule (30 mg total) by mouth daily. Take one hour before or 2 hours after meals 30 capsule 2    Glucose Blood (BLOOD GLUCOSE TEST STRIPS 333) STRP 1 each by In Vitro route in the morning, at noon, in the  evening, and at bedtime. (Patient not taking: Reported on 08/07/2023) 100 strip 1    Prenatal Vit-Fe Fumarate-FA (PRENATAL VITAMINS) 28-0.8 MG TABS Take 1 tablet by mouth daily. 100 tablet 11      Review of Systems   All systems reviewed and negative except as stated in HPI  Blood pressure (!) 144/88, pulse 88, temperature 98 F (36.7 C), temperature source Oral, resp. rate 17, height 5\' 4"  (1.626 m), weight 86.3 kg, last menstrual period 01/16/2023, unknown if currently breastfeeding. General appearance: alert and no distress Lungs: clear to auscultation bilaterally Heart: regular rate and rhythm Abdomen: soft, non-tender; bowel sounds normal Extremities:no sign of DVT Presentation: cephalic Fetal monitoringBaseline: 140 bpm, Variability: Good {> 6 bpm), Accelerations: Reactive, and Decelerations: Absent Uterine activityFrequency: irregular Dilation: Fingertip Effacement (%): Thick Station: -3 Exam by:: Quin Hoop, SMW   Prenatal labs: ABO, Rh: --/--/A POS (01/26 5284) Antibody: NEG (01/26 1324) Rubella: 1.04 (07/22 1106) RPR: Non Reactive (11/15 1000)  HBsAg: Negative (07/22 1106)  HIV: Non Reactive (11/15 1000)  GBS: Negative/-- (01/17 1302)    Lab Results  Component Value Date   GBS Negative 09/27/2023    There is no immunization history for the selected administration types on file for this patient.  Prenatal Transfer Tool  Maternal Diabetes: Yes:  Diabetes Type:  Diet controlled Genetic Screening: Normal Maternal Ultrasounds/Referrals: Normal Fetal Ultrasounds or other Referrals:  None Maternal Substance Abuse:  No Significant Maternal Medications:  None Significant Maternal Lab Results: Group B Strep negative Number of Prenatal Visits:greater than 3 verified prenatal visits Maternal Vaccinations: declined  Other Comments:  None   Results for orders placed or performed during the hospital encounter of 10/06/23 (from the past 24 hours)  CBC   Collection  Time: 10/06/23  8:22 AM  Result Value Ref Range   WBC 6.2 4.0 - 10.5 K/uL   RBC 3.51 (L) 3.87 - 5.11 MIL/uL   Hemoglobin 9.2 (L) 12.0 - 15.0 g/dL   HCT 40.1 (L) 02.7 - 25.3 %   MCV 83.2 80.0 - 100.0 fL   MCH 26.2 26.0 - 34.0 pg   MCHC 31.5 30.0 - 36.0 g/dL   RDW 66.4 40.3 - 47.4 %   Platelets 227 150 - 400 K/uL   nRBC 0.0 0.0 - 0.2 %  Comprehensive metabolic panel   Collection Time: 10/06/23  8:22 AM  Result Value Ref Range   Sodium 138 135 - 145 mmol/L  Potassium 3.9 3.5 - 5.1 mmol/L   Chloride 104 98 - 111 mmol/L   CO2 22 22 - 32 mmol/L   Glucose, Bld 83 70 - 99 mg/dL   BUN <5 (L) 6 - 20 mg/dL   Creatinine, Ser 4.09 0.44 - 1.00 mg/dL   Calcium 8.6 (L) 8.9 - 10.3 mg/dL   Total Protein 6.3 (L) 6.5 - 8.1 g/dL   Albumin 2.6 (L) 3.5 - 5.0 g/dL   AST 21 15 - 41 U/L   ALT 12 0 - 44 U/L   Alkaline Phosphatase 148 (H) 38 - 126 U/L   Total Bilirubin 0.6 0.0 - 1.2 mg/dL   GFR, Estimated >81 >19 mL/min   Anion gap 12 5 - 15  Type and screen   Collection Time: 10/06/23  8:22 AM  Result Value Ref Range   ABO/RH(D) A POS    Antibody Screen NEG    Sample Expiration      10/09/2023,2359 Performed at [facility], [address], [place], [place] [address]   Glucose, capillary   Collection Time: 10/06/23  8:36 AM  Result Value Ref Range   Glucose-Capillary 74 70 - 99 mg/dL    Patient Active Problem List   Diagnosis Date Noted   Gestational hypertension 10/04/2023   Elevated blood pressure affecting pregnancy, antepartum 09/27/2023   Gestational diabetes mellitus (GDM) in third trimester 07/30/2023   Supervision of other normal pregnancy, antepartum 04/01/2023    Assessment/Plan:  Julie Clayton is a 23 y.o. G2P0010 at [redacted]w[redacted]d here for IOL for gHTN and A1GDM.   #Labor: double cytotec, recheck #Pain: IV pain medication #FWB: Cat I #GBS status:  negative #Feeding: Breastmilk  and Formula #Reproductive Life planning: Undecided, possibly IUD   Elige Ko,  Student-MidWife  10/06/2023, 10:10 AM  I was present for the exam and agree with above.  Katrinka Blazing, [place], [place] 10/06/2023 3:50 PM

## 2023-10-06 NOTE — Progress Notes (Signed)
Subjective: Patient tolerating contractions well. She denies any concerns at this time.   Objective: BP 138/81   Pulse 61   Temp 98 F (36.7 C) (Oral)   Resp 18   Ht 5\' 4"  (1.626 m)   Wt 86.3 kg   LMP 01/16/2023 Comment: elective abortion November 09, 2022  BMI 32.66 kg/m    FHT:  FHR: 135 bpm, variability: moderate,  accelerations:  Present,  decelerations:  Absent UC:   regular, every 2-4 minutes SVE:   Dilation: 5 Effacement (%): 70 Station: -2 Exam by:: Cheree Ditto, SMW  Labs: Lab Results  Component Value Date   WBC 6.2 10/06/2023   HGB 9.2 (L) 10/06/2023   HCT 29.2 (L) 10/06/2023   MCV 83.2 10/06/2023   PLT 227 10/06/2023    Assessment / Plan: IUP [redacted]w[redacted]d IOL for gHTN. S/P cytotec and FB. Discussed R/B/A for AROM. Patient verbalized consent to AROM.  Labor: AROM, plan to start pitocin if not progressing post AROM. gHTN: no severe range pressures or features. Fetal Wellbeing:  Category I Pain Control:   IV pain medication PRN I/D:  GBS neg Anticipated MOD:  NSVD  Elige Ko, Student-MidWife 10/06/2023, 4:49 PM

## 2023-10-06 NOTE — Anesthesia Preprocedure Evaluation (Signed)
Anesthesia Evaluation  Patient identified by MRN, date of birth, ID band Patient awake    Reviewed: Allergy & Precautions, Patient's Chart, lab work & pertinent test results  History of Anesthesia Complications Negative for: history of anesthetic complications  Airway Mallampati: II  TM Distance: >3 FB Neck ROM: Full    Dental no notable dental hx.    Pulmonary neg pulmonary ROS   Pulmonary exam normal        Cardiovascular hypertension (gestational), (-) CAD Normal cardiovascular exam     Neuro/Psych negative neurological ROS     GI/Hepatic negative GI ROS, Neg liver ROS,,,  Endo/Other  diabetes, Gestational    Renal/GU negative Renal ROS     Musculoskeletal negative musculoskeletal ROS (+)    Abdominal   Peds  Hematology negative hematology ROS (+)   Anesthesia Other Findings Day of surgery medications reviewed with patient.  Reproductive/Obstetrics (+) Pregnancy                             Anesthesia Physical Anesthesia Plan  ASA: 2  Anesthesia Plan: Epidural   Post-op Pain Management:    Induction:   PONV Risk Score and Plan: Treatment may vary due to age or medical condition  Airway Management Planned: Natural Airway  Additional Equipment: Fetal Monitoring  Intra-op Plan:   Post-operative Plan:   Informed Consent: I have reviewed the patients History and Physical, chart, labs and discussed the procedure including the risks, benefits and alternatives for the proposed anesthesia with the patient or authorized representative who has indicated his/her understanding and acceptance.       Plan Discussed with:   Anesthesia Plan Comments:        Anesthesia Quick Evaluation

## 2023-10-06 NOTE — Progress Notes (Signed)
Called to room as prolonged deceleration to the 80s.  By time to the room pIVF bolus initiated and tried both R lateral and was just moved to L lateral position.  Per nursing report pt made cervical change to 9.5/90/0.  FHT still within the 80s so transitioned to all fours and gave Terb IM x 1.  FHT improved to 140s with great variability and thusly moved back to L lateral position with sustained reassuring FHT.  Will CTM closely and reassured about possibility of still having vaginal delivery however counseled on risks / benefits of C/S in case it was recommended moving forward.  Pt understanding and agreeable if necessary and also agreeable to blood products.   Mittie Bodo, MD Family Medicine - Obstetrics Fellow

## 2023-10-07 ENCOUNTER — Encounter (HOSPITAL_COMMUNITY): Payer: Self-pay | Admitting: Family Medicine

## 2023-10-07 DIAGNOSIS — O134 Gestational [pregnancy-induced] hypertension without significant proteinuria, complicating childbirth: Secondary | ICD-10-CM

## 2023-10-07 DIAGNOSIS — O24420 Gestational diabetes mellitus in childbirth, diet controlled: Secondary | ICD-10-CM

## 2023-10-07 DIAGNOSIS — Z3A37 37 weeks gestation of pregnancy: Secondary | ICD-10-CM

## 2023-10-07 LAB — CBC
HCT: 29.7 % — ABNORMAL LOW (ref 36.0–46.0)
HCT: 31 % — ABNORMAL LOW (ref 36.0–46.0)
Hemoglobin: 9.4 g/dL — ABNORMAL LOW (ref 12.0–15.0)
Hemoglobin: 9.6 g/dL — ABNORMAL LOW (ref 12.0–15.0)
MCH: 26.2 pg (ref 26.0–34.0)
MCH: 25.7 pg — ABNORMAL LOW (ref 26.0–34.0)
MCHC: 31.6 g/dL (ref 30.0–36.0)
MCHC: 31 g/dL (ref 30.0–36.0)
MCV: 82.7 fL (ref 80.0–100.0)
MCV: 82.9 fL (ref 80.0–100.0)
Platelets: 202 10*3/uL (ref 150–400)
Platelets: 240 10*3/uL (ref 150–400)
RBC: 3.59 MIL/uL — ABNORMAL LOW (ref 3.87–5.11)
RBC: 3.74 MIL/uL — ABNORMAL LOW (ref 3.87–5.11)
RDW: 13.8 % (ref 11.5–15.5)
RDW: 14.1 % (ref 11.5–15.5)
WBC: 16.2 10*3/uL — ABNORMAL HIGH (ref 4.0–10.5)
WBC: 18.5 10*3/uL — ABNORMAL HIGH (ref 4.0–10.5)
nRBC: 0 % (ref 0.0–0.2)
nRBC: 0 % (ref 0.0–0.2)

## 2023-10-07 LAB — RPR: RPR Ser Ql: NONREACTIVE

## 2023-10-07 LAB — BIRTH TISSUE RECOVERY COLLECTION (PLACENTA DONATION)

## 2023-10-07 MED ORDER — NIFEDIPINE ER OSMOTIC RELEASE 30 MG PO TB24
30.0000 mg | ORAL_TABLET | Freq: Every day | ORAL | Status: DC
  Administered 2023-10-07 – 2023-10-08 (×2): 30 mg via ORAL
  Filled 2023-10-07 (×2): qty 1

## 2023-10-07 MED ORDER — COCONUT OIL OIL: 1.0000 | TOPICAL_OIL | Status: DC | PRN

## 2023-10-07 MED ORDER — DIPHENHYDRAMINE HCL 25 MG PO CAPS: 25.0000 mg | ORAL_CAPSULE | Freq: Four times a day (QID) | ORAL | Status: DC | PRN

## 2023-10-07 MED ORDER — TETANUS-DIPHTH-ACELL PERTUSSIS 5-2.5-18.5 LF-MCG/0.5 IM SUSY
0.5000 mL | PREFILLED_SYRINGE | Freq: Once | INTRAMUSCULAR | Status: DC
Start: 2023-10-08 — End: 2023-10-08

## 2023-10-07 MED ORDER — TRANEXAMIC ACID-NACL 1000-0.7 MG/100ML-% IV SOLN
INTRAVENOUS | Status: AC
  Filled 2023-10-07: qty 100

## 2023-10-07 MED ORDER — SENNOSIDES-DOCUSATE SODIUM 8.6-50 MG PO TABS
2.0000 | ORAL_TABLET | ORAL | Status: DC
Start: 2023-10-07 — End: 2023-10-08
  Administered 2023-10-07 – 2023-10-08 (×2): 2 via ORAL
  Filled 2023-10-07 (×2): qty 2

## 2023-10-07 MED ORDER — SIMETHICONE 80 MG PO CHEW: 80.0000 mg | CHEWABLE_TABLET | ORAL | Status: DC | PRN

## 2023-10-07 MED ORDER — ONDANSETRON HCL 4 MG/2ML IJ SOLN: 4.0000 mg | INTRAMUSCULAR | Status: DC | PRN

## 2023-10-07 MED ORDER — ZOLPIDEM TARTRATE 5 MG PO TABS: 5.0000 mg | ORAL_TABLET | Freq: Every evening | ORAL | Status: DC | PRN

## 2023-10-07 MED ORDER — IBUPROFEN 600 MG PO TABS
600.0000 mg | ORAL_TABLET | Freq: Four times a day (QID) | ORAL | Status: DC
  Administered 2023-10-07 – 2023-10-08 (×6): 600 mg via ORAL
  Filled 2023-10-07 (×6): qty 1

## 2023-10-07 MED ORDER — PRENATAL MULTIVITAMIN CH
1.0000 | ORAL_TABLET | Freq: Every day | ORAL | Status: DC
Start: 2023-10-07 — End: 2023-10-08
  Administered 2023-10-07 – 2023-10-08 (×2): 1 via ORAL
  Filled 2023-10-07 (×2): qty 1

## 2023-10-07 MED ORDER — TRANEXAMIC ACID-NACL 1000-0.7 MG/100ML-% IV SOLN: 1000.0000 mg | INTRAVENOUS | Status: AC

## 2023-10-07 MED ORDER — POTASSIUM CHLORIDE CRYS ER 20 MEQ PO TBCR
40.0000 meq | EXTENDED_RELEASE_TABLET | Freq: Every day | ORAL | Status: DC
  Administered 2023-10-07 – 2023-10-08 (×2): 40 meq via ORAL
  Filled 2023-10-07 (×2): qty 2

## 2023-10-07 MED ORDER — ONDANSETRON HCL 4 MG PO TABS: 4.0000 mg | ORAL_TABLET | ORAL | Status: DC | PRN

## 2023-10-07 MED ORDER — WITCH HAZEL-GLYCERIN EX PADS: 1.0000 | MEDICATED_PAD | CUTANEOUS | Status: DC | PRN

## 2023-10-07 MED ORDER — FUROSEMIDE 20 MG PO TABS
20.0000 mg | ORAL_TABLET | Freq: Every day | ORAL | Status: DC
  Administered 2023-10-08: 20 mg via ORAL
  Filled 2023-10-07: qty 1

## 2023-10-07 MED ORDER — FUROSEMIDE 20 MG PO TABS
20.0000 mg | ORAL_TABLET | Freq: Once | ORAL | Status: AC
  Administered 2023-10-07: 20 mg via ORAL
  Filled 2023-10-07: qty 1

## 2023-10-07 MED ORDER — FUROSEMIDE 20 MG PO TABS: 20.0000 mg | ORAL_TABLET | Freq: Every day | ORAL | Status: DC

## 2023-10-07 MED ORDER — DIBUCAINE (PERIANAL) 1 % EX OINT: 1.0000 | TOPICAL_OINTMENT | CUTANEOUS | Status: DC | PRN

## 2023-10-07 MED ORDER — SODIUM CHLORIDE 0.9% FLUSH: 3.0000 mL | Freq: Two times a day (BID) | INTRAVENOUS | Status: DC

## 2023-10-07 MED ORDER — SODIUM CHLORIDE 0.9 % IV SOLN: 250.0000 mL | INTRAVENOUS | Status: DC | PRN

## 2023-10-07 MED ORDER — OXYTOCIN-SODIUM CHLORIDE 30-0.9 UT/500ML-% IV SOLN
1.0000 m[IU]/min | INTRAVENOUS | Status: DC
  Administered 2023-10-07: 2 m[IU]/min via INTRAVENOUS

## 2023-10-07 MED ORDER — TERBUTALINE SULFATE 1 MG/ML IJ SOLN
0.2500 mg | Freq: Once | INTRAMUSCULAR | Status: DC | PRN
Start: 2023-10-07 — End: 2023-10-07

## 2023-10-07 MED ORDER — BENZOCAINE-MENTHOL 20-0.5 % EX AERO
1.0000 | INHALATION_SPRAY | CUTANEOUS | Status: DC | PRN
  Administered 2023-10-07: 1 via TOPICAL
  Filled 2023-10-07: qty 56

## 2023-10-07 MED ORDER — ACETAMINOPHEN 325 MG PO TABS: 650.0000 mg | ORAL_TABLET | ORAL | Status: DC | PRN

## 2023-10-07 MED ORDER — METHYLERGONOVINE MALEATE 0.2 MG/ML IJ SOLN
INTRAMUSCULAR | Status: AC
  Filled 2023-10-07: qty 1

## 2023-10-07 MED ORDER — SODIUM CHLORIDE 0.9% FLUSH: 3.0000 mL | INTRAVENOUS | Status: DC | PRN

## 2023-10-07 NOTE — Discharge Summary (Signed)
Postpartum Discharge Summary  Date of Service updated***     Patient Name: Julie Clayton DOB: 2001-06-13 MRN: [id number]  Date of admission: 10/06/2023 Delivery date:10/07/2023 Delivering provider: CHUBB, CASEY C Date of discharge: 10/07/2023  Admitting diagnosis: Gestational hypertension [O13.9] Intrauterine pregnancy: [redacted]w[redacted]d     Secondary diagnosis:  Principal Problem:   Gestational hypertension  Additional problems: ***    Discharge diagnosis: Term Pregnancy Delivered, Gestational Hypertension, and GDM A1                                              Post partum procedures:{Postpartum procedures:23558} Augmentation: AROM, Pitocin, Cytotec, and IP Foley Complications: None  Hospital course: Induction of Labor With Vaginal Delivery   23 y.o. yo G2P0010 at [redacted]w[redacted]d was admitted to the hospital 10/06/2023 for induction of labor.  Indication for induction: Gestational hypertension and A1 DM.  Patient had an labor course that was uncomplicated.  Membrane Rupture Time/Date: 4:35 PM,10/06/2023  Delivery Method:Vaginal, Spontaneous Operative Delivery:N/A Episiotomy: None Lacerations:    Details of delivery can be found in separate delivery note.  Patient had a postpartum course complicated by***. Patient is discharged home 10/07/23.  Newborn Data: Birth date:10/07/2023 Birth time:3:02 AM Gender:Female Living status:Living Apgars:8 ,9  Weight:   Magnesium Sulfate received: {Mag received:30440022} BMZ received: No Rhophylac:N/A MMR:N/A T-DaP:declined Flu: declined  RSV Vaccine received: {RSV:31013} Transfusion:{Transfusion received:30440034}  Immunizations received: There is no immunization history for the selected administration types on file for this patient.  Physical exam  Vitals:   10/07/23 0031 10/07/23 0101 10/07/23 0131 10/07/23 0231  BP: 119/89 (!) 143/104 139/76 (!) 145/71  Pulse: (!) 158 (!) 125 94 95  Resp: 15 16 16    Temp:  98.6 F (37 C)    TempSrc:  Oral     Weight:      Height:       General: {Exam; general:21111117} Lochia: {Desc; appropriate/inappropriate:30686::"appropriate"} Uterine Fundus: {Desc; firm/soft:30687} Incision: {Exam; incision:21111123} DVT Evaluation: {Exam; dvt:2111122} Labs: Lab Results  Component Value Date   WBC 9.6 10/06/2023   HGB 10.5 (L) 10/06/2023   HCT 33.7 (L) 10/06/2023   MCV 82.4 10/06/2023   PLT 246 10/06/2023      Latest Ref Rng & Units 10/06/2023    8:22 AM  CMP  Glucose 70 - 99 mg/dL 83   BUN 6 - 20 mg/dL <5   Creatinine 4.09 - 1.00 mg/dL 8.11   Sodium 914 - 782 mmol/L 138   Potassium 3.5 - 5.1 mmol/L 3.9   Chloride 98 - 111 mmol/L 104   CO2 22 - 32 mmol/L 22   Calcium 8.9 - 10.3 mg/dL 8.6   Total Protein 6.5 - 8.1 g/dL 6.3   Total Bilirubin 0.0 - 1.2 mg/dL 0.6   Alkaline Phos 38 - 126 U/L 148   AST 15 - 41 U/L 21   ALT 0 - 44 U/L 12    Edinburgh Score:     No data to display         No data recorded  After visit meds:  Allergies as of 10/07/2023   No Known Allergies   Med Rec must be completed prior to using this [facility]***        Discharge home in stable condition Infant Feeding: {Baby feeding:23562} Infant Disposition:{CHL IP OB HOME WITH NFAOZH:08657} Discharge instruction: per After Visit Summary and Postpartum  booklet. Activity: Advance as tolerated. Pelvic rest for 6 weeks.  Diet: {OB UEAV:40981191} Future Appointments: Future Appointments  Date Time Provider Department Center  10/11/2023 10:15 AM Jerene Bears, MD [facility]  10/18/2023 10:55 AM Lo, Toma Aran, CNM [facility]  10/25/2023 10:35 AM Lo, Toma Aran, CNM [facility]   Follow up Visit: Message sent to DB 1/27  Please schedule this patient for a In person postpartum visit in 4 weeks with the following provider: Any provider. Additional Postpartum F/U:2 hour GTT and BP check 1 week  High risk pregnancy complicated by: GDM and HTN Delivery mode:  Vaginal, Spontaneous Anticipated Birth Control:   Unsure   10/07/2023 Hessie Dibble, MD

## 2023-10-07 NOTE — Lactation Note (Signed)
This note was copied from a baby's chart. Lactation Consultation Note  Patient Name: Julie Clayton Today's Date: 10/07/2023 Age:23 hours  LC was going to see mom in L&D and RN told LC that mom stated she doesn't want to see Lactation and she wanted formula. RN told mom that if she changes her mind to ask for Lactation.   Maternal Data    Feeding    LATCH Score                    Lactation Tools Discussed/Used    Interventions    Discharge    Consult Status Consult Status: Complete    Emanuel Dowson G 10/07/2023, 3:53 AM

## 2023-10-08 ENCOUNTER — Other Ambulatory Visit (HOSPITAL_COMMUNITY): Payer: Self-pay

## 2023-10-08 LAB — GLUCOSE, CAPILLARY: Glucose-Capillary: 65 mg/dL — ABNORMAL LOW (ref 70–99)

## 2023-10-08 MED ORDER — ACETAMINOPHEN 325 MG PO TABS
650.0000 mg | ORAL_TABLET | ORAL | 1 refills | Status: AC | PRN
  Filled 2023-10-08: qty 60, 5d supply, fill #0

## 2023-10-08 MED ORDER — IBUPROFEN 600 MG PO TABS
600.0000 mg | ORAL_TABLET | Freq: Four times a day (QID) | ORAL | 0 refills | Status: AC
  Filled 2023-10-08: qty 60, 15d supply, fill #0

## 2023-10-08 MED ORDER — NIFEDIPINE ER 30 MG PO TB24
30.0000 mg | ORAL_TABLET | Freq: Every day | ORAL | 1 refills | Status: AC
  Filled 2023-10-08: qty 30, 30d supply, fill #0

## 2023-10-08 MED ORDER — FUROSEMIDE 20 MG PO TABS
20.0000 mg | ORAL_TABLET | Freq: Every day | ORAL | 0 refills | Status: AC
  Filled 2023-10-08: qty 3, 3d supply, fill #0

## 2023-10-08 NOTE — Social Work (Signed)
CSW received consult for hx of Anxiety and Depression.  CSW met with MOB to offer support and complete assessment.    CSW met with MOB at bedside and introduced role. CSW observed MOB sitting on the bed and a visitor was present holding the infant who MOB introduced as her cousin, Destiny. MOB presented pleasant and welcomed CSW to complete the assessment. CSW offered MOB privacy. MOB gave CSW permission to share all information with her cousin present. CSW asked MOB how she has been feeling since giving birth. MOB reported that she was doing ok. CSW inquired how MOB felt during the pregnancy. MOB reported for the first six months into her pregnancy "things felt rocky." MOB acknowledged that she had anxiety and depression symptoms. MOB disclosed that she had relationship concerns with FOB. MOB explained since ending the relationship with FOB she has felt more "peaceful." MOB reported that FOB visited the infant, and she assumed that he would be involved with the infant. CSW inquired if MOB had domestic violence concerns and if she felt safe. MOB denied domestic violence concerns and stated that she felt safe. CSW inquired if MOB had taken medication and/or received therapy. MOB reported that she was prescribed Lexapro and had access to the medication, if needed. MOB reported that she was offered therapy but did not feel she needed it once she ended the relationship with FOB. CSW inquired about MOB supports. MOB identified her mom, cousin and aunt as supports.   CSW provided education regarding the baby blues period vs. perinatal mood disorders, discussed treatment and gave resources for mental health follow up if concerns arise.  CSW recommends self-evaluation during the postpartum time period using the New Mom Checklist from Postpartum Progress and encouraged MOB to contact a medical professional if symptoms are noted at any time. MOB reported that she felt comfortable reaching out to her medical provider if  she has concerns. CSW assessed MOB for safety. MOB denied SI/HI.   CSW inquired if MOB had essential items to care for the infant. MOB reported that she had all essential items to care for the infant including a car seat and bassinet. MOB reported that she has WIC/SNAP benefits and will let social service know that she gave birth. CSW inquired if MOB had chosen a pediatrician for the infant. MOB reported that she had chosen [facility] and was waiting to hear if they were accepting new patients. MOB reported that she has transportation to the appointments.   CSW provided review of Sudden Infant Death Syndrome (SIDS) precautions. MOB reported understanding.    CSW identifies no further need for intervention and no barriers to discharge at this time.  Vivi Barrack, MSW, LCSW Clinical Social Worker  [phone] 2024-05-08  12:31 PM

## 2023-10-08 NOTE — Progress Notes (Addendum)
Post Partum Day #1 comfortable in bed. Discussed her concerns with breastfeeding and demonstrated hand expression with her.   Subjective: Patient has no complaints, up ad lib, voiding, and tolerating PO. She reports bleeding appropriate for PPD 1, and some pain in her bottom she attributes to a vaginal delivery. Pain well controlled with Ibuprofen. Objective: Blood pressure 126/82, pulse 83, temperature 98.5 F (36.9 C), temperature source Oral, resp. rate 18, height 5\' 4"  (1.626 m), weight 86.3 kg, last menstrual period 01/16/2023, SpO2 99%, unknown if currently breastfeeding.  Physical Exam:  General: alert and no distress Lochia: appropriate Uterine Fundus: firm Laceration:healing well DVT Evaluation: No evidence of DVT seen on physical exam.  Recent Labs    10/07/23 0444 10/07/23 1107  HGB 9.4* 9.6*  HCT 29.7* 31.0*    Assessment/Plan:  PPD#1 progressing well Bps stable Breast and formula feeding     Elige Ko, Student-MidWife 10/08/2023, 8:02 AM

## 2023-10-08 NOTE — Anesthesia Postprocedure Evaluation (Signed)
Anesthesia Post Note  Patient: Designer, television/film set  Procedure(s) Performed: AN AD HOC LABOR EPIDURAL     Patient location during evaluation: Mother Baby Anesthesia Type: Epidural Level of consciousness: awake, oriented and awake and alert Pain management: pain level controlled Vital Signs Assessment: post-procedure vital signs reviewed and stable Respiratory status: spontaneous breathing, respiratory function stable and nonlabored ventilation Cardiovascular status: stable Postop Assessment: no headache, adequate PO intake, able to ambulate, patient able to bend at knees and no apparent nausea or vomiting Anesthetic complications: no   No notable events documented.  Last Vitals:  Vitals:   10/07/23 1840 10/08/23 0555  BP: 132/76 126/82  Pulse: 100 83  Resp: 16 18  Temp: 36.8 C 36.9 C  SpO2: 99% 99%    Last Pain:  Vitals:   10/08/23 0800  TempSrc:   PainSc: 0-No pain   Pain Goal:                Epidural/Spinal Function Cutaneous sensation: Normal sensation (10/08/23 0800), Patient able to flex knees: Yes (10/08/23 0800), Patient able to lift hips off bed: Yes (10/08/23 0800), Back pain beyond tenderness at insertion site: No (10/08/23 0800), Progressively worsening motor and/or sensory loss: No (10/08/23 0800), Bowel and/or bladder incontinence post epidural: No (10/08/23 0800)  Analucia Hush

## 2023-10-11 ENCOUNTER — Encounter (HOSPITAL_BASED_OUTPATIENT_CLINIC_OR_DEPARTMENT_OTHER): Payer: BLUE CROSS/BLUE SHIELD | Admitting: Certified Nurse Midwife

## 2023-10-11 ENCOUNTER — Encounter (HOSPITAL_BASED_OUTPATIENT_CLINIC_OR_DEPARTMENT_OTHER): Payer: Self-pay

## 2023-10-16 ENCOUNTER — Telehealth (HOSPITAL_COMMUNITY): Payer: Self-pay

## 2023-10-16 NOTE — Telephone Encounter (Signed)
 10/16/2023 1149  Name: Julie Clayton MRN: [id number] DOB: Feb 01, 2001  Reason for Call:  Transition of Care Hospital Discharge Call  Contact Status: Patient Contact Status: Message  Language assistant needed:          Follow-Up Questions:    Van Postnatal Depression Scale:  In the Past 7 Days:    PHQ2-9 Depression Scale:     Discharge Follow-up:    Post-discharge interventions: NA  Signature  Rosaline Deretha PEAK

## 2023-10-18 ENCOUNTER — Encounter (HOSPITAL_BASED_OUTPATIENT_CLINIC_OR_DEPARTMENT_OTHER): Payer: BLUE CROSS/BLUE SHIELD | Admitting: Certified Nurse Midwife

## 2023-10-25 ENCOUNTER — Encounter (HOSPITAL_BASED_OUTPATIENT_CLINIC_OR_DEPARTMENT_OTHER): Payer: BLUE CROSS/BLUE SHIELD | Admitting: Certified Nurse Midwife

## 2023-11-19 ENCOUNTER — Ambulatory Visit (INDEPENDENT_AMBULATORY_CARE_PROVIDER_SITE_OTHER): Payer: BLUE CROSS/BLUE SHIELD | Admitting: Certified Nurse Midwife

## 2023-11-19 VITALS — BP 143/77 | HR 77 | Ht 64.0 in | Wt 159.4 lb

## 2023-11-19 DIAGNOSIS — O24410 Gestational diabetes mellitus in pregnancy, diet controlled: Secondary | ICD-10-CM

## 2023-11-19 DIAGNOSIS — Z8632 Personal history of gestational diabetes: Secondary | ICD-10-CM

## 2023-11-19 DIAGNOSIS — O139 Gestational [pregnancy-induced] hypertension without significant proteinuria, unspecified trimester: Secondary | ICD-10-CM

## 2023-11-19 NOTE — Progress Notes (Signed)
  [id number] Term SVD at 37w4 of a viable female 10/06/23 7lb. Hx A1GDM and GHTN.   CBC, CMP, A1C planned today  Pap 04/01/23 Negative   Subjective:     Julie Clayton is a 23 y.o. female who presents for a postpartum visit. She is 6 weeks postpartum following a spontaneous vaginal delivery. I have fully reviewed the prenatal and intrapartum course. The delivery was at 37 gestational weeks. Outcome: spontaneous vaginal delivery. Anesthesia: epidural. Postpartum course has been uneventful. Baby's course has been uneventful. Baby is feeding by  formula . Bleeding no bleeding. Bowel function is normal. Bladder function is normal. Patient is not sexually active. Contraception method is abstinence. Postpartum depression screening: negative.  The following portions of the patient's history were reviewed and updated as appropriate: allergies, current medications, past family history, past medical history, past social history, past surgical history, and problem list.  Review of Systems Pertinent items are noted in HPI.   Objective:    BP (!) 143/77 (BP Location: Right Arm, Patient Position: Sitting, Cuff Size: Normal)   Pulse 77   Ht 5\' 4"  (1.626 m)   Wt 159 lb 6.4 oz (72.3 kg)   LMP 11/09/2023 Comment: elective abortion November 09, 2022  Breastfeeding No   BMI 27.36 kg/m   General:  alert, cooperative, and appears stated age   Breasts:  inspection negative, no nipple discharge or bleeding, no masses or nodularity palpable  Lungs:   Heart:    Abdomen: soft, non-tender; bowel sounds normal; no masses,  no organomegaly   Vulva:  normal  Vagina: normal vagina, no discharge, exudate, lesion, or erythema  Cervix:  multiparous appearance  Corpus:   Adnexa:    Rectal Exam:         Assessment:    G2P1011 Term SVD of a viable female here for routine  postpartum exam. Pap smear not due  at today's visit.  Hx GHTN and GDM  Plan:    1. Contraception: abstinence 2. Plan to RTO for A1C due to Hx  GDM  Letta Kocher

## 2024-09-19 ENCOUNTER — Other Ambulatory Visit: Payer: Self-pay

## 2024-09-19 ENCOUNTER — Emergency Department (HOSPITAL_COMMUNITY)
Admission: EM | Admit: 2024-09-19 | Discharge: 2024-09-20 | Disposition: A | Attending: Emergency Medicine | Admitting: Emergency Medicine

## 2024-09-19 ENCOUNTER — Encounter (HOSPITAL_COMMUNITY): Payer: Self-pay | Admitting: Emergency Medicine

## 2024-09-19 DIAGNOSIS — J029 Acute pharyngitis, unspecified: Secondary | ICD-10-CM | POA: Diagnosis not present

## 2024-09-19 DIAGNOSIS — R0981 Nasal congestion: Secondary | ICD-10-CM | POA: Diagnosis present

## 2024-09-19 LAB — GROUP A STREP BY PCR: Group A Strep by PCR: NOT DETECTED

## 2024-09-19 NOTE — ED Triage Notes (Signed)
 Pt reports sore throat x 1 week. Reports she has been taking tylenol  w/ no relief. Reports she feels like she has strep

## 2024-09-20 LAB — MONONUCLEOSIS SCREEN: Mono Screen: NEGATIVE

## 2024-09-20 NOTE — ED Provider Notes (Signed)
 " [facility] AT [facility] Provider Note  CSN: [id number] Arrival date & time: 09/19/24 1956  Chief Complaint(s) Sore Throat  History provided by patient. HPI & MDM Julie Clayton is a 24 y.o. female .   Sore Throat  Patient presented for sore throat for the past 3 weeks.  Endorsed nightly nasal congestion that improves throughout the day.  Denies any fevers or chills.  No coughing or congestion.  No nausea or vomiting.  No abdominal pain.  No known sick contacts.   Medical Decision Making Amount and/or Complexity of Data Reviewed Labs: ordered. Decision-making details documented in ED Course.   Differential diagnosis considered. Group A strep negative.  Will send Monospot given duration of symptoms. Likely viral pharyngitis Exam not concerning for peritonsillar abscess, RPA, epiglottitis.   Final Clinical Impression(s) / ED Diagnoses Final diagnoses:  Sore throat   The patient appears reasonably screened and/or stabilized for discharge and I doubt any other medical condition or other [facility] requiring further screening, evaluation, or treatment in the ED at this time. I have discussed the findings, Dx and Tx plan with the patient/family who expressed understanding and agree(s) with the plan. Discharge instructions discussed at length. The patient/family was given strict return precautions who verbalized understanding of the instructions. No further questions at time of discharge.  Disposition: Discharge  Condition: Good  ED Discharge Orders     None         Follow Up: Primary care provider  Call  if you do not have a primary care physician, contact [facility] at [phone] for referral     Past Medical History Past Medical History:  Diagnosis Date   Medical history non-contributory    Patient Active Problem List   Diagnosis Date Noted   Postpartum care following vaginal delivery 11/19/2023   Vaginal delivery 10/08/2023    Gestational hypertension 10/04/2023   Gestational diabetes mellitus (GDM) in third trimester 07/30/2023   Supervision of other normal pregnancy, antepartum 04/01/2023   Home Medication(s) Prior to Admission medications  Medication Sig Start Date End Date Taking? Authorizing Provider  acetaminophen  (TYLENOL ) 325 MG tablet Take 2 tablets (650 mg total) by mouth every 4 (four) hours as needed (for pain scale < 4). Patient not taking: Reported on 11/19/2023 10/08/23   Kandis Devaughn Sayres, MD  Ferric Maltol  (ACCRUFER ) 30 MG CAPS Take 1 capsule (30 mg total) by mouth daily. Take one hour before or 2 hours after meals Patient not taking: Reported on 11/19/2023 09/30/23   Lo, Arland POUR, CNM  furosemide  (LASIX ) 20 MG tablet Take 1 tablet (20 mg total) by mouth daily. Patient not taking: Reported on 11/19/2023 10/08/23   Kandis Devaughn Sayres, MD  ibuprofen  (ADVIL ) 600 MG tablet Take 1 tablet (600 mg total) by mouth every 6 (six) hours. Patient not taking: Reported on 11/19/2023 10/08/23   Kandis Devaughn Sayres, MD  NIFEdipine  (ADALAT  CC) 30 MG 24 hr tablet Take 1 tablet (30 mg total) by mouth daily. Patient not taking: Reported on 11/19/2023 10/08/23   Wouk, Devaughn Sayres, MD  Prenatal Vit-Fe Fumarate-FA (PRENATAL VITAMINS) 28-0.8 MG TABS Take 1 tablet by mouth daily. Patient not taking: Reported on 11/19/2023 06/28/23   Lo, Arland POUR, CNM  Allergies Patient has no known allergies.  Review of Systems Review of Systems As noted in HPI  Physical Exam Vital Signs  I have reviewed the triage vital signs BP (!) 154/63 (BP Location: Left Arm)   Pulse 73   Temp 98.4 F (36.9 C) (Oral)   Resp 16   LMP 09/19/2024 (Exact Date)   SpO2 100%   Physical Exam Vitals reviewed.  Constitutional:      General: She is not in acute distress.    Appearance: She is well-developed. She is not  diaphoretic.  HENT:     Head: Normocephalic and atraumatic.     Right Ear: External ear normal.     Left Ear: External ear normal.     Nose: Nose normal.     Mouth/Throat:     Lips: No lesions.     Mouth: No oral lesions.     Pharynx: No oropharyngeal exudate or posterior oropharyngeal erythema.     Tonsils: No tonsillar exudate or tonsillar abscesses. 3+ on the right. 3+ on the left.  Eyes:     General: No scleral icterus.    Conjunctiva/sclera: Conjunctivae normal.  Neck:     Trachea: Phonation normal.  Cardiovascular:     Rate and Rhythm: Normal rate and regular rhythm.  Pulmonary:     Effort: Pulmonary effort is normal. No respiratory distress.     Breath sounds: No stridor.  Abdominal:     General: There is no distension.  Musculoskeletal:        General: Normal range of motion.     Cervical back: Normal range of motion.  Lymphadenopathy:     Cervical: Cervical adenopathy (shotty) present.  Neurological:     Mental Status: She is alert and oriented to person, place, and time.  Psychiatric:        Behavior: Behavior normal.     ED Results and Treatments Labs (all labs ordered are listed, but only abnormal results are displayed) Labs Reviewed  GROUP A STREP BY PCR  MONONUCLEOSIS SCREEN                                                                                                                         EKG  EKG Interpretation Date/Time:    Ventricular Rate:    PR Interval:    QRS Duration:    QT Interval:    QTC Calculation:   R Axis:      Text Interpretation:         Radiology No results found.  Medications Ordered in ED Medications - No data to display Procedures Procedures  (including critical care time)   This chart was dictated using voice recognition software.  Despite best efforts to proofread,  errors can occur which can change the documentation meaning.   Trine Raynell Moder, MD 09/20/24 [phone]  "
